# Patient Record
Sex: Male | Born: 1980
Health system: Southern US, Community
[De-identification: ages and names within clinical notes are randomized; demographics above are authoritative.]

## PROBLEM LIST (undated history)

## (undated) DIAGNOSIS — F419 Anxiety disorder, unspecified: Secondary | ICD-10-CM

## (undated) HISTORY — PX: OTHER SURGICAL HISTORY: SHX169

---

## 2004-09-24 ENCOUNTER — Emergency Department (HOSPITAL_COMMUNITY): Admission: EM | Admit: 2004-09-24 | Discharge: 2004-09-24 | Payer: Self-pay | Admitting: Emergency Medicine

## 2004-10-06 ENCOUNTER — Emergency Department (HOSPITAL_COMMUNITY): Admission: EM | Admit: 2004-10-06 | Discharge: 2004-10-07 | Payer: Self-pay | Admitting: Emergency Medicine

## 2004-10-09 ENCOUNTER — Emergency Department (HOSPITAL_COMMUNITY): Admission: EM | Admit: 2004-10-09 | Discharge: 2004-10-09 | Payer: Self-pay | Admitting: Emergency Medicine

## 2004-10-15 ENCOUNTER — Encounter: Admission: RE | Admit: 2004-10-15 | Discharge: 2004-10-15 | Payer: Self-pay | Admitting: Gastroenterology

## 2005-04-25 ENCOUNTER — Emergency Department (HOSPITAL_COMMUNITY): Admission: EM | Admit: 2005-04-25 | Discharge: 2005-04-25 | Payer: Self-pay | Admitting: Emergency Medicine

## 2005-04-26 ENCOUNTER — Emergency Department (HOSPITAL_COMMUNITY): Admission: EM | Admit: 2005-04-26 | Discharge: 2005-04-27 | Payer: Self-pay | Admitting: Emergency Medicine

## 2005-04-26 ENCOUNTER — Emergency Department (HOSPITAL_COMMUNITY): Admission: EM | Admit: 2005-04-26 | Discharge: 2005-04-26 | Payer: Self-pay | Admitting: Emergency Medicine

## 2005-04-28 ENCOUNTER — Ambulatory Visit: Payer: Self-pay | Admitting: *Deleted

## 2005-05-04 ENCOUNTER — Emergency Department (HOSPITAL_COMMUNITY): Admission: EM | Admit: 2005-05-04 | Discharge: 2005-05-04 | Payer: Self-pay | Admitting: Emergency Medicine

## 2005-05-14 ENCOUNTER — Ambulatory Visit: Admission: RE | Admit: 2005-05-14 | Discharge: 2005-05-14 | Payer: Self-pay | Admitting: *Deleted

## 2005-05-23 ENCOUNTER — Emergency Department (HOSPITAL_COMMUNITY): Admission: EM | Admit: 2005-05-23 | Discharge: 2005-05-23 | Payer: Self-pay | Admitting: Emergency Medicine

## 2005-05-24 ENCOUNTER — Emergency Department (HOSPITAL_COMMUNITY): Admission: EM | Admit: 2005-05-24 | Discharge: 2005-05-24 | Payer: Self-pay | Admitting: Emergency Medicine

## 2005-05-31 ENCOUNTER — Emergency Department (HOSPITAL_COMMUNITY): Admission: EM | Admit: 2005-05-31 | Discharge: 2005-05-31 | Payer: Self-pay | Admitting: Emergency Medicine

## 2005-06-02 ENCOUNTER — Emergency Department (HOSPITAL_COMMUNITY): Admission: EM | Admit: 2005-06-02 | Discharge: 2005-06-02 | Payer: Self-pay | Admitting: Emergency Medicine

## 2005-06-02 ENCOUNTER — Ambulatory Visit: Payer: Self-pay | Admitting: *Deleted

## 2005-06-13 ENCOUNTER — Emergency Department (HOSPITAL_COMMUNITY): Admission: EM | Admit: 2005-06-13 | Discharge: 2005-06-13 | Payer: Self-pay | Admitting: Emergency Medicine

## 2006-04-06 ENCOUNTER — Emergency Department (HOSPITAL_COMMUNITY): Admission: EM | Admit: 2006-04-06 | Discharge: 2006-04-06 | Payer: Self-pay | Admitting: Family Medicine

## 2008-01-17 ENCOUNTER — Emergency Department (HOSPITAL_COMMUNITY): Admission: EM | Admit: 2008-01-17 | Discharge: 2008-01-18 | Payer: Self-pay | Admitting: Emergency Medicine

## 2008-04-16 ENCOUNTER — Emergency Department (HOSPITAL_COMMUNITY): Admission: EM | Admit: 2008-04-16 | Discharge: 2008-04-16 | Payer: Self-pay | Admitting: Emergency Medicine

## 2008-04-26 ENCOUNTER — Emergency Department (HOSPITAL_COMMUNITY): Admission: EM | Admit: 2008-04-26 | Discharge: 2008-04-26 | Payer: Self-pay | Admitting: Emergency Medicine

## 2008-05-21 ENCOUNTER — Ambulatory Visit: Payer: Self-pay | Admitting: Cardiology

## 2008-05-21 ENCOUNTER — Encounter: Payer: Self-pay | Admitting: Cardiovascular Disease

## 2008-05-21 ENCOUNTER — Ambulatory Visit: Payer: Self-pay | Admitting: Cardiovascular Disease

## 2008-05-21 ENCOUNTER — Ambulatory Visit: Payer: Self-pay

## 2009-03-18 ENCOUNTER — Emergency Department (HOSPITAL_COMMUNITY): Admission: EM | Admit: 2009-03-18 | Discharge: 2009-03-18 | Payer: Self-pay | Admitting: Emergency Medicine

## 2009-03-25 ENCOUNTER — Encounter: Payer: Self-pay | Admitting: Cardiovascular Disease

## 2009-04-25 ENCOUNTER — Encounter: Payer: Self-pay | Admitting: Cardiovascular Disease

## 2009-05-15 DIAGNOSIS — R079 Chest pain, unspecified: Secondary | ICD-10-CM

## 2009-05-15 DIAGNOSIS — R0602 Shortness of breath: Secondary | ICD-10-CM | POA: Insufficient documentation

## 2009-05-15 DIAGNOSIS — R002 Palpitations: Secondary | ICD-10-CM

## 2009-06-02 ENCOUNTER — Encounter (INDEPENDENT_AMBULATORY_CARE_PROVIDER_SITE_OTHER): Payer: Self-pay | Admitting: *Deleted

## 2009-09-25 ENCOUNTER — Emergency Department (HOSPITAL_COMMUNITY): Admission: EM | Admit: 2009-09-25 | Discharge: 2009-09-26 | Payer: Self-pay | Admitting: Emergency Medicine

## 2010-01-13 IMAGING — CR DG CHEST 2V
2 series · 2 of 2 positions shown · non-contrast
Comparison: 04/16/2008 and earlier.

CLINICAL DATA: 27-year-old male with dizziness and shortness of
breath.

CHEST - 2 VIEW

[w chest pa]
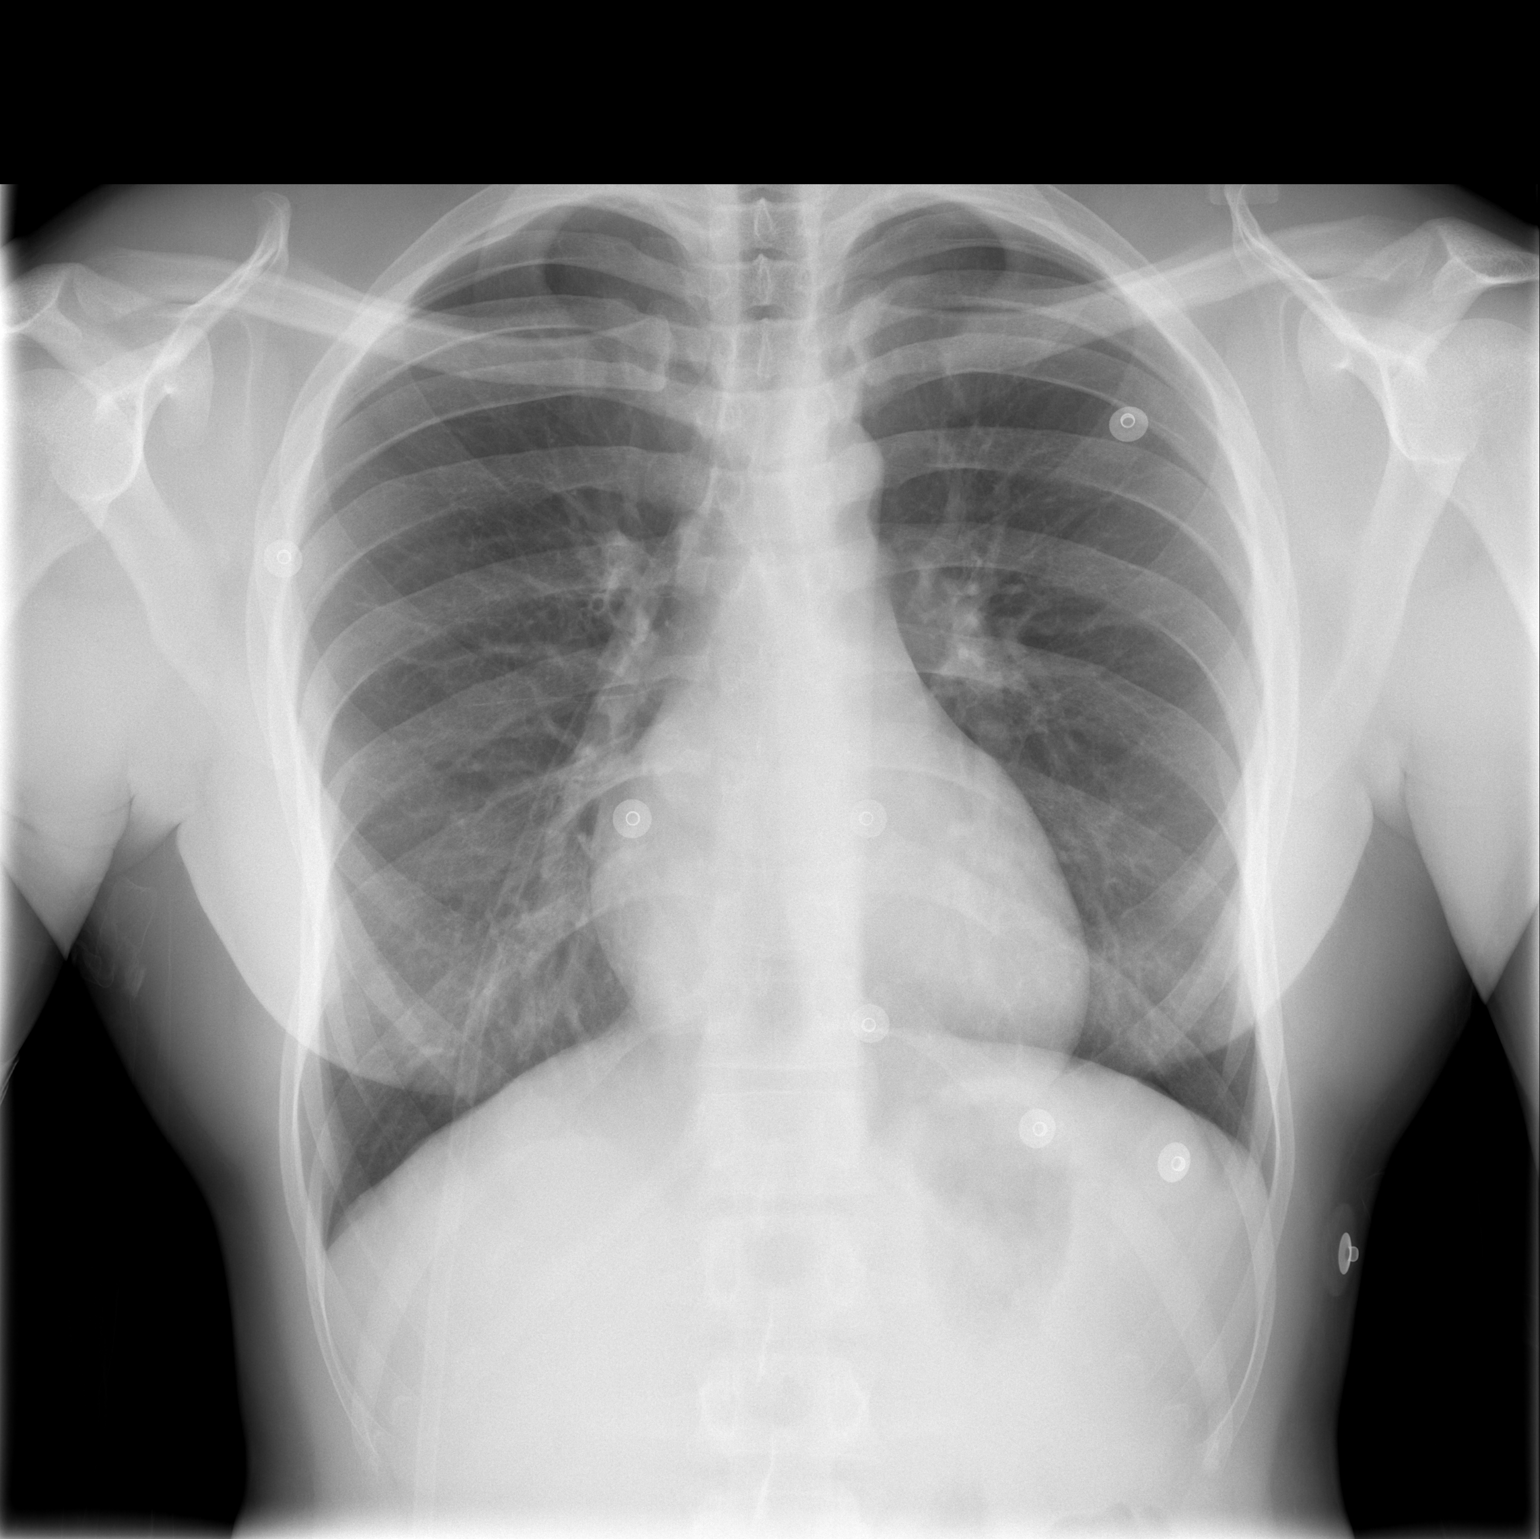

[w chest lat]
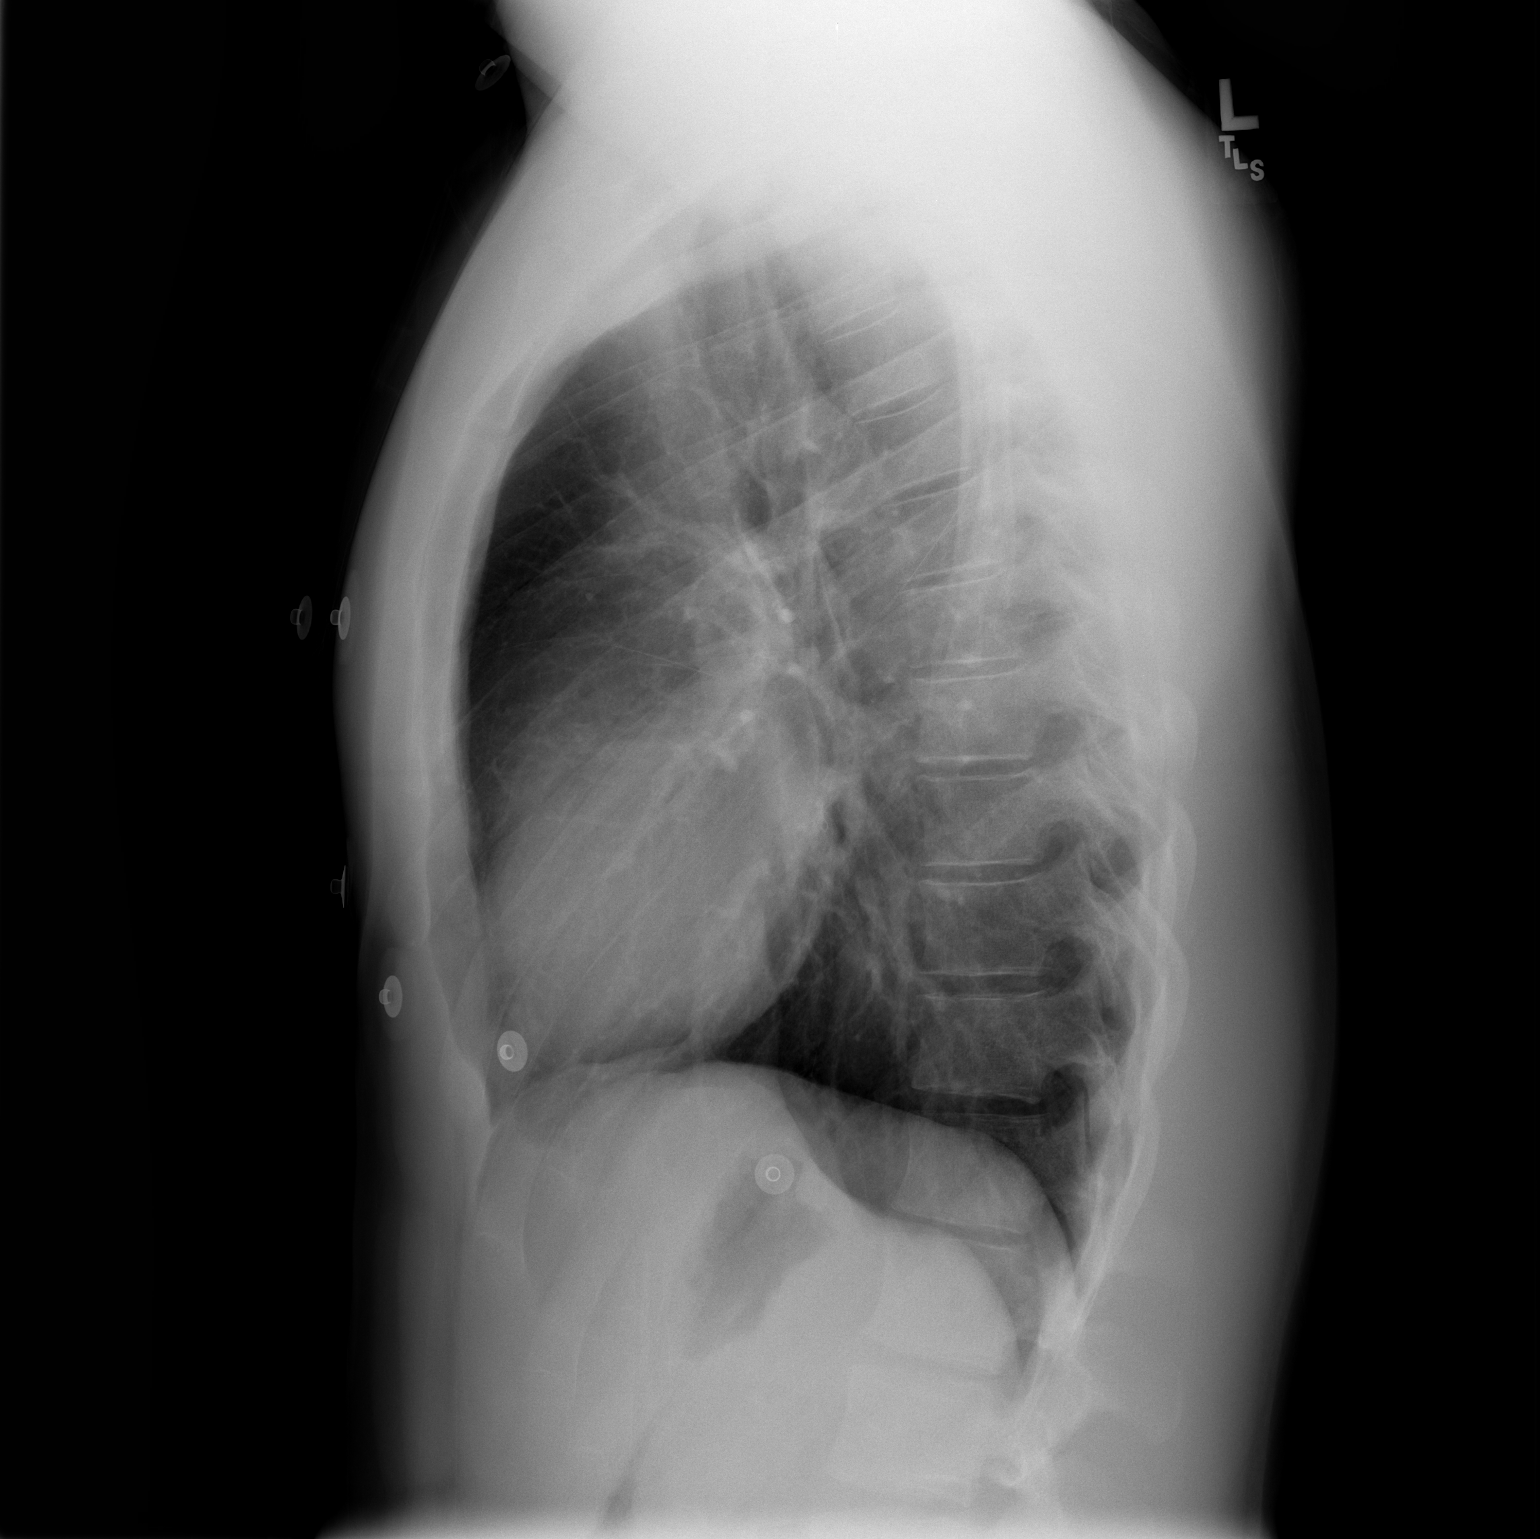

[2 of 2 positions shown; findings below may reference images not displayed]

FINDINGS: Normal cardiac size and mediastinal contours.  Normal
lung volumes.  No pneumothorax or pleural effusion.  The lungs
remain clear.  Tracheal air column within normal limits. No acute
osseous abnormality identified.
IMPRESSION: No acute cardiopulmonary abnormality.

## 2010-01-28 ENCOUNTER — Emergency Department (HOSPITAL_COMMUNITY): Admission: EM | Admit: 2010-01-28 | Discharge: 2010-01-28 | Payer: Self-pay | Admitting: Family Medicine

## 2010-01-30 ENCOUNTER — Emergency Department (HOSPITAL_COMMUNITY): Admission: EM | Admit: 2010-01-30 | Discharge: 2010-01-31 | Payer: Self-pay | Admitting: Emergency Medicine

## 2010-10-31 ENCOUNTER — Emergency Department (HOSPITAL_COMMUNITY)
Admission: EM | Admit: 2010-10-31 | Discharge: 2010-10-31 | Disposition: A | Discharge status: Home / Self Care | Payer: BC Managed Care – PPO | Attending: Emergency Medicine | Admitting: Emergency Medicine

## 2010-10-31 ENCOUNTER — Emergency Department (HOSPITAL_COMMUNITY): Payer: BC Managed Care – PPO

## 2010-10-31 DIAGNOSIS — I319 Disease of pericardium, unspecified: Secondary | ICD-10-CM | POA: Insufficient documentation

## 2010-10-31 DIAGNOSIS — J45909 Unspecified asthma, uncomplicated: Secondary | ICD-10-CM | POA: Insufficient documentation

## 2010-10-31 DIAGNOSIS — R079 Chest pain, unspecified: Secondary | ICD-10-CM | POA: Insufficient documentation

## 2010-10-31 DIAGNOSIS — Z7982 Long term (current) use of aspirin: Secondary | ICD-10-CM | POA: Insufficient documentation

## 2010-12-06 LAB — CBC
Hemoglobin: 15 g/dL (ref 13.0–17.0)
MCHC: 34.7 g/dL (ref 30.0–36.0)
Platelets: 234 10*3/uL (ref 150–400)
RDW: 13.4 % (ref 11.5–15.5)

## 2010-12-06 LAB — BASIC METABOLIC PANEL
BUN: 11 mg/dL (ref 6–23)
CO2: 27 mEq/L (ref 19–32)
Calcium: 9 mg/dL (ref 8.4–10.5)
GFR calc non Af Amer: >60 mL/min (ref >60)
Glucose, Bld: 104 mg/dL — ABNORMAL HIGH (ref 70–99)

## 2010-12-06 LAB — URINALYSIS, ROUTINE W REFLEX MICROSCOPIC
Bilirubin Urine: NEGATIVE
Hgb urine dipstick: NEGATIVE
Nitrite: NEGATIVE
Protein, ur: NEGATIVE mg/dL
Specific Gravity, Urine: 1.029 (ref 1.005–1.030)
Urobilinogen, UA: 0.2 mg/dL (ref 0.0–1.0)

## 2010-12-06 LAB — RAPID URINE DRUG SCREEN, HOSP PERFORMED
Amphetamines: NOT DETECTED
Tetrahydrocannabinol: NOT DETECTED

## 2010-12-06 LAB — DIFFERENTIAL
Basophils Absolute: 0 10*3/uL (ref 0.0–0.1)
Basophils Relative: 0 % (ref 0–1)
Eosinophils Relative: 1 % (ref 0–5)
Lymphocytes Relative: 8 % — ABNORMAL LOW (ref 12–46)
Monocytes Absolute: 0.9 10*3/uL (ref 0.1–1.0)
Neutro Abs: 10.6 10*3/uL — ABNORMAL HIGH (ref 1.7–7.7)

## 2010-12-06 LAB — POCT CARDIAC MARKERS
CKMB, poc: <1 ng/mL — ABNORMAL LOW (ref 1.0–8.0)
Myoglobin, poc: 54.1 ng/mL (ref 12–200)
Troponin i, poc: <0.05 ng/mL (ref 0.00–0.09)

## 2010-12-06 LAB — D-DIMER, QUANTITATIVE: D-Dimer, Quant: 0.29 ug/mL-FEU (ref 0.00–0.48)

## 2011-02-02 NOTE — Assessment & Plan Note (Signed)
[facility]                            CARDIOLOGY OFFICE NOTE   Billy Clarke, Billy Clarke                       MRN:          [id number]  DATE:05/21/2008                            DOB:          16-Sep-1981    HISTORY OF PRESENT ILLNESS:  Mr. Plucinski is a pleasant 30 year old African  American male with past medical history significant for asthma and a  questionable pericarditis 3 years ago who returns today for cardiology  followup with complaints of 3 weeks of atypical left-sided chest pain.  The patient states that approximately 4 weeks ago he had the onset of  left-sided chest pain that occurs under his left breast and on the left  chest wall.  There is radiation of the pain to his neck and shoulder.  The pain has been constant for the last 4 weeks and is associated with  occasional palpitations and shortness of breath.  He has continued to  work full time and has continued to work out in [employer] with this pain.  He was seen in the Emergency Department at [facility] on May 02, 2008.  Workup at that time included an EKG, which showed early  repolarization, a negative chest x-ray, and normal laboratory values.  He was sent home with plan of taking ibuprofen 3 times daily for  possible pericarditis.  Today, he notes that the ibuprofen has not  changed his symptoms significantly.  He has been having this chest  discomfort constantly throughout the day with frequent episodes of  shortness of breath and palpitations.  He is also complaining of some  dizziness when he arises from a seated position to a standing position.  He denies any syncopal episodes.  He also denies any orthopnea, PND, or  lower extremity edema.   His past medical history is significant for asthma and questionable  pericarditis 3 years ago.   PAST SURGICAL HISTORY:  None.   ALLERGIES:  No known drug allergies.   MEDICATIONS:  Ibuprofen 800 mg 3 times daily.   SOCIAL  HISTORY:  The patient has a history of remote tobacco use but has  not smoked in the last 3 years.  He denies use of alcohol or illicit  drugs.  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   FAMILY HISTORY:  He denies any family history of premature coronary  artery disease or sudden cardiac death.  His mother and father are in  their early 7s and are healthy.  He has one older sister and two  younger brothers that are all healthy.   REVIEW OF SYSTEMS:  As stated in history of present illness and is  otherwise negative.   PHYSICAL EXAMINATION:  GENERAL:  He is a pleasant and thin young [place]  American male in no acute distress.  VITAL SIGNS:  Blood pressure 121/68, pulse 51 and regular, and  respirations 12 and unlabored.  NECK:  No JVD and no carotid bruits.  OROPHARYNX:  Mucous membranes moist.  SKIN:  Warm and dry.  LUNGS:  Clear to auscultation bilaterally without  wheezes, rhonchi, or  crackles noted.  CARDIOVASCULAR:  Bradycardia with regular rhythm and no murmurs,  gallops, or rubs noted.  There are no lifts or thrills noted.  ABDOMEN:  Soft.  Bowel sounds present.  EXTREMITIES:  No evidence of edema.  Pulses are 2+ in all extremities.   DIAGNOSTIC STUDIES:  1. A 12-lead electrocardiogram obtained in our office today shows      sinus bradycardia with a ventricular rate of 51 beats per minute      and voltage criteria for left ventricular hypertrophy with early      repolarization abnormalities.  This EKG does not appear consistent      with pericarditis.  When compared to an EKG from June 02, 2005, it is essentially unchanged.  2. Recent laboratory values from April 26, 2008, show white blood cell      count of 8.6, hemoglobin 15.2, platelets 202, sodium 141, potassium      3.8, creatinine 1.35, and troponin 0.05.   ASSESSMENT AND PLAN:  This is a pleasant 30 year old [place] American  male with a history of asthma and questionable pericarditis in  the past  who presents with left-sided chest pain that is atypical in nature and  has been occurring continuously for the last 4 weeks.  This has been  associated with occasional shortness of breath and palpitations.  His  workup 3 years ago for the same complaints included an EKG, which looks  very much like the one today.  His workup then also included an exercise  treadmill study.  The patient exercised into stage 3 of the Bruce  protocol at that time and had normalization of his ST segments during  exercise that returned back to a repolarization abnormality during rest.  He was seen by Dr. Glennon Hamilton at that time back in 2006.  His echo had  been done at an outside facility and the tape was obtained and read by  Dr. Eden Emms here in our office.  He did not feel that there was any  significant fluid collection at that time.  The differential diagnosis  for this atypical chest pain does include pericarditis versus  musculoskeletal.  I will proceed today with obtaining an echocardiogram  to rule out any pericardial fluid and also a 24-hour Holter monitor with  his complaints of palpitations.  The patient does deny any syncopal  episodes but does have some dizziness.  He describes to me no family  history of sudden cardiac death.  I do not feel that his story is  consistent with arrhythmic events; however, I cannot confirm this, as he  has had no continuous telemetry monitoring.  If his echocardiogram and  Holter monitor are nonrevealing, I may consider a cardiac MRI to rule  out any infiltrative heart diseases in this young [place] American male  with complaints of palpitations, shortness of breath, and chest pain.  I  will plan on seeing him back in my office in 2 weeks to review the  results of his studies.  I will make no other medication changes now.  He is encouraged to continue taking his ibuprofen 800 mg 3 times daily.     Verne Carrow, MD  Electronically Signed    CM/MedQ  DD: 05/21/2008  DT: 05/22/2008  Job #: [id number]   cc:   Everardo Beals. Juanda Chance, MD, [facility]

## 2011-06-15 LAB — DIFFERENTIAL
Basophils Absolute: 0.1
Basophils Relative: 1
Eosinophils Absolute: 0.8 — ABNORMAL HIGH
Monocytes Relative: 9
Neutrophils Relative %: 51

## 2011-06-15 LAB — CBC
HCT: 43.1
Hemoglobin: 14.9
RBC: 4.95
WBC: 8.5

## 2011-06-15 LAB — INFLUENZA A+B VIRUS AG-DIRECT(RAPID)
Inflenza A Ag: NEGATIVE
Influenza B Ag: NEGATIVE

## 2011-06-15 LAB — COMPREHENSIVE METABOLIC PANEL
ALT: 32
Alkaline Phosphatase: 44
BUN: 12
CO2: 28
Chloride: 104
GFR calc non Af Amer: >60
Glucose, Bld: 99
Potassium: 3.5
Sodium: 137
Total Bilirubin: 1
Total Protein: 7

## 2011-06-15 LAB — POCT CARDIAC MARKERS: Operator id: 295131

## 2011-06-18 LAB — DIFFERENTIAL
Basophils Absolute: 0
Basophils Absolute: 0.1
Eosinophils Relative: 2
Lymphocytes Relative: 16
Lymphocytes Relative: 17
Monocytes Absolute: 0.8
Monocytes Relative: 10
Neutro Abs: 6.1
Neutrophils Relative %: 73

## 2011-06-18 LAB — POCT URINALYSIS DIP (DEVICE)
Bilirubin Urine: NEGATIVE
Glucose, UA: NEGATIVE
Nitrite: NEGATIVE

## 2011-06-18 LAB — POCT I-STAT, CHEM 8
BUN: 11
Creatinine, Ser: 1.5
HCT: 48
Hemoglobin: 16
Hemoglobin: 16.3
Potassium: 3.9
Potassium: 4.1
Sodium: 142
Sodium: 141
TCO2: 30

## 2011-06-18 LAB — COMPREHENSIVE METABOLIC PANEL
Albumin: 4
BUN: 8
Creatinine, Ser: 1.35
Total Bilirubin: 1.4 — ABNORMAL HIGH
Total Protein: 7

## 2011-06-18 LAB — CBC
HCT: 44.2
HCT: 44.8
MCV: 88.3
Platelets: 251
Platelets: 202
RDW: 13.7
RDW: 14.1

## 2011-06-18 LAB — PROTIME-INR
INR: 1.1
Prothrombin Time: 14.5

## 2011-06-18 LAB — RAPID URINE DRUG SCREEN, HOSP PERFORMED
Cocaine: NOT DETECTED
Opiates: NOT DETECTED

## 2011-06-18 LAB — POCT CARDIAC MARKERS: Troponin i, poc: <0.05

## 2011-06-18 LAB — LIPASE, BLOOD: Lipase: 19

## 2011-07-24 ENCOUNTER — Emergency Department (HOSPITAL_COMMUNITY)
Admission: EM | Admit: 2011-07-24 | Discharge: 2011-07-25 | Disposition: A | Discharge status: Home / Self Care | Payer: BC Managed Care – PPO | Attending: Emergency Medicine | Admitting: Emergency Medicine

## 2011-07-24 DIAGNOSIS — M25569 Pain in unspecified knee: Secondary | ICD-10-CM | POA: Insufficient documentation

## 2011-07-24 DIAGNOSIS — Z79899 Other long term (current) drug therapy: Secondary | ICD-10-CM | POA: Insufficient documentation

## 2011-07-24 DIAGNOSIS — M546 Pain in thoracic spine: Secondary | ICD-10-CM | POA: Insufficient documentation

## 2011-07-24 DIAGNOSIS — S335XXA Sprain of ligaments of lumbar spine, initial encounter: Secondary | ICD-10-CM | POA: Insufficient documentation

## 2011-07-24 DIAGNOSIS — F411 Generalized anxiety disorder: Secondary | ICD-10-CM | POA: Insufficient documentation

## 2011-07-24 DIAGNOSIS — M545 Low back pain, unspecified: Secondary | ICD-10-CM | POA: Insufficient documentation

## 2011-07-24 DIAGNOSIS — X58XXXA Exposure to other specified factors, initial encounter: Secondary | ICD-10-CM | POA: Insufficient documentation

## 2011-07-24 DIAGNOSIS — M2550 Pain in unspecified joint: Secondary | ICD-10-CM | POA: Insufficient documentation

## 2011-07-24 DIAGNOSIS — IMO0001 Reserved for inherently not codable concepts without codable children: Secondary | ICD-10-CM | POA: Insufficient documentation

## 2011-07-24 DIAGNOSIS — J45909 Unspecified asthma, uncomplicated: Secondary | ICD-10-CM | POA: Insufficient documentation

## 2011-07-24 DIAGNOSIS — M2560 Stiffness of unspecified joint, not elsewhere classified: Secondary | ICD-10-CM | POA: Insufficient documentation

## 2011-08-27 ENCOUNTER — Emergency Department (INDEPENDENT_AMBULATORY_CARE_PROVIDER_SITE_OTHER)
Admission: EM | Admit: 2011-08-27 | Discharge: 2011-08-27 | Disposition: A | Discharge status: Home / Self Care | Payer: BC Managed Care – PPO | Source: Home / Self Care | Attending: Emergency Medicine | Admitting: Emergency Medicine

## 2011-08-27 ENCOUNTER — Emergency Department (INDEPENDENT_AMBULATORY_CARE_PROVIDER_SITE_OTHER): Payer: BC Managed Care – PPO

## 2011-08-27 ENCOUNTER — Encounter: Payer: Self-pay | Admitting: *Deleted

## 2011-08-27 DIAGNOSIS — S6980XA Other specified injuries of unspecified wrist, hand and finger(s), initial encounter: Secondary | ICD-10-CM

## 2011-08-27 DIAGNOSIS — IMO0002 Reserved for concepts with insufficient information to code with codable children: Secondary | ICD-10-CM

## 2011-08-27 DIAGNOSIS — S6990XA Unspecified injury of unspecified wrist, hand and finger(s), initial encounter: Secondary | ICD-10-CM

## 2011-08-27 HISTORY — DX: Anxiety disorder, unspecified: F41.9

## 2011-08-27 MED ORDER — CEPHALEXIN 500 MG PO CAPS: 500.0000 mg | ORAL_CAPSULE | Freq: Four times a day (QID) | ORAL | Status: AC

## 2011-08-27 NOTE — ED Notes (Signed)
Pt is here with left second finger injury.  Pt dropped a TV on finger Sunday night.  Finger is red, swollen and painful.  Pt is able to bend.

## 2011-08-27 NOTE — ED Provider Notes (Addendum)
History     CSN: [id number] Arrival date & time: 08/27/2011  9:27 AM   First MD Initiated Contact with Patient 08/27/11 [phone]      Chief Complaint  Patient presents with  . Finger Injury    (Consider location/radiation/quality/duration/timing/severity/associated sxs/prior treatment) HPI Comments: Was moving and lifting a TV Sunday the TV fell on my L index finger, it bled a lot and its very swollen, looks liek my nail its broken and hurst all over here ( points to DIP)  Patient is a 30 y.o. male presenting with hand pain.  Hand Pain This is a new problem. Episode onset: 5 DAYS AGO"    Past Medical History  Diagnosis Date  . Asthma   . Anxiety     History reviewed. No pertinent past surgical history.  History reviewed. No pertinent family history.  History  Substance Use Topics  . Smoking status: Never Smoker   . Smokeless tobacco: Not on file  . Alcohol Use: Yes     occasionally      Review of Systems  Musculoskeletal: Positive for joint swelling.    Allergies  Review of patient's allergies indicates no known allergies.  Home Medications   Current Outpatient Rx  Name Route Sig Dispense Refill  . ALBUTEROL SULFATE HFA 108 (90 BASE) MCG/ACT IN AERS Inhalation Inhale 2 puffs into the lungs every 6 (six) hours as needed. Wheezing or shortness of breath     . ASPIRIN 500 MG PO TABS Oral Take 500 mg by mouth every 6 (six) hours as needed. Pain or muscle aches     . LORAZEPAM 1 MG PO TABS Oral Take 1 mg by mouth daily as needed. anxiety       BP 131/65  Pulse 66  Temp(Src) 99.1 F (37.3 C) (Oral)  Resp 16  SpO2 99%  Physical Exam  Nursing note and vitals reviewed. Constitutional: He appears well-developed and well-nourished.  HENT:  Head: Normocephalic.  Musculoskeletal: He exhibits tenderness.       Left wrist: He exhibits tenderness, swelling and deformity.       Arms: Neurological: He is alert.    ED Course  Procedures (including critical care  time)  Labs Reviewed - No data to display Dg Finger Index Left  08/27/2011  *RADIOLOGY REPORT*  Clinical Data: Trauma.  Pain.  LEFT INDEX FINGER 2+V  Comparison: None.  Findings: No acute bony abnormality.  Specifically, no fracture, subluxation, or dislocation.  Soft tissues are prominent along the nail bed.  IMPRESSION: No acute bony abnormality.  Original Report Authenticated By: Cyndie Chime, M.D.     No diagnosis found.    MDM  NAIL BED PARTIAL AVULSION LUNULA- DETACHED, NO SUBUNGUAL HEMATOMA. NO FRACTURES        Jimmie Molly, MD 08/27/11 1044  Jimmie Molly, MD 08/27/11 1048

## 2012-02-03 ENCOUNTER — Ambulatory Visit (INDEPENDENT_AMBULATORY_CARE_PROVIDER_SITE_OTHER): Payer: BC Managed Care – PPO | Admitting: Family Medicine

## 2012-02-03 VITALS — BP 129/79 | HR 65 | Temp 98.1°F | Resp 16 | Ht 71.0 in | Wt 201.6 lb

## 2012-02-03 DIAGNOSIS — F411 Generalized anxiety disorder: Secondary | ICD-10-CM

## 2012-02-03 DIAGNOSIS — F419 Anxiety disorder, unspecified: Secondary | ICD-10-CM

## 2012-02-03 NOTE — Progress Notes (Signed)
  Subjective:    Patient ID: Billy Clarke, male    DOB: 02-20-1981, 31 y.o.   MRN: [id number]  HPI 31 yo male here complaining of anxiety and panic attack. Had panic attack at work today.  Took Lorazepam.  Felt better.  THen started feeling dizzy.  Now feeling better.   Grandfather just died, Grandmother rushed to hospital, and work stress all weighing.   Brought here by work friends because he was feeling dizzy.  Dizziness passed in about 30 minutes.  Anxiety attack lasted about 15 minutes, abated quckly after the lorazepam.    Review of Systems Negative except as per HPI     Objective:   Physical Exam  Constitutional: He appears well-developed and well-nourished.  Cardiovascular: Normal rate, regular rhythm, normal heart sounds and intact distal pulses.   No murmur heard. Pulmonary/Chest: Effort normal and breath sounds normal.  Neurological: He is alert.  Skin: Skin is warm and dry.          Assessment & Plan:  Anxiety Dizziness - likely side effect of lorazepam.

## 2012-09-12 ENCOUNTER — Ambulatory Visit: Payer: BC Managed Care – PPO

## 2012-09-14 ENCOUNTER — Encounter (HOSPITAL_COMMUNITY): Payer: Self-pay | Admitting: Emergency Medicine

## 2012-09-14 ENCOUNTER — Emergency Department (INDEPENDENT_AMBULATORY_CARE_PROVIDER_SITE_OTHER): Payer: BC Managed Care – PPO

## 2012-09-14 ENCOUNTER — Emergency Department (INDEPENDENT_AMBULATORY_CARE_PROVIDER_SITE_OTHER)
Admission: EM | Admit: 2012-09-14 | Discharge: 2012-09-14 | Disposition: A | Discharge status: Home / Self Care | Payer: BC Managed Care – PPO | Source: Home / Self Care | Attending: Emergency Medicine | Admitting: Emergency Medicine

## 2012-09-14 DIAGNOSIS — S60229A Contusion of unspecified hand, initial encounter: Secondary | ICD-10-CM

## 2012-09-14 MED ORDER — TRAMADOL HCL 50 MG PO TABS: 100.0000 mg | ORAL_TABLET | Freq: Three times a day (TID) | ORAL | Status: DC | PRN

## 2012-09-14 MED ORDER — MELOXICAM 15 MG PO TABS: 15.0000 mg | ORAL_TABLET | Freq: Every day | ORAL | Status: DC

## 2012-09-14 NOTE — ED Provider Notes (Signed)
Chief Complaint  Patient presents with  . Hand Injury    History of Present Illness:   The patient is a 31 year old male who injured his left hand at work a week ago. He thinks he might have banged it on a machine. He is left-handed and his head pain over the first 3 digits of the left hand radiating into the palm of the hand, wrist, forearm, elbow, and to the shoulder and the hand feels numb and tingly and feels weak. He works in [employer] and does a lot of lifting and manual work. He denies any swelling.  Review of Systems:  Other than noted above, the patient denies any of the following symptoms: Systemic:  No fevers, chills, sweats, or aches.  No fatigue or tiredness. Musculoskeletal:  No joint pain, arthritis, bursitis, swelling, back pain, or neck pain. Neurological:  No muscular weakness, paresthesias, headache, or trouble with speech or coordination.  No dizziness.  PMFSH:  Past medical history, family history, social history, meds, and allergies were reviewed.  Physical Exam:   Vital signs:  BP 128/75  Pulse 59  Temp 98.2 F (36.8 C) (Oral)  Resp 18  SpO2 100% Gen:  Alert and oriented times 3.  In no distress. Musculoskeletal: He has diffuse pain to palpation over his entire left hand. There is no swelling, bruising, or deformity. All joints have full range of motion with pain. Sensation and strength are intact. Tinel's and Phalen's signs are negative. Otherwise, all joints had a full a ROM with no swelling, bruising or deformity.  No edema, pulses full. Extremities were warm and pink.  Capillary refill was brisk.  Skin:  Clear, warm and dry.  No rash. Neuro:  Alert and oriented times 3.  Muscle strength was normal.  Sensation was intact to light touch.   Radiology:  Dg Wrist Complete Left  09/14/2012  *RADIOLOGY REPORT*  Clinical Data: Wrist pain 1 week without known trauma.  LEFT WRIST - COMPLETE 3+ VIEW  Comparison:  None.  Findings:  There is no evidence of  fracture or dislocation.  There is no evidence of arthropathy or other focal bone abnormality. Soft tissues are unremarkable.  IMPRESSION: Negative.   Original Report Authenticated By: Janeece Riggers, M.D.    Dg Hand Complete Left  09/14/2012  *RADIOLOGY REPORT*  Clinical Data: Hand pain without known injury.  LEFT HAND - COMPLETE 3+ VIEW  Comparison:  None.  Findings:  There is no evidence of fracture or dislocation.  There is no evidence of arthropathy or other focal bone abnormality. Soft tissues are unremarkable.  IMPRESSION: Negative.   Original Report Authenticated By: Janeece Riggers, M.D.    I reviewed the images independently and personally and concur with the radiologist's findings.  Course in Urgent Care Center:   The patient was put in a thumb spica splint.  Assessment:  The encounter diagnosis was Contusion, hand.  Plan:   1.  The following meds were prescribed:   New Prescriptions   MELOXICAM (MOBIC) 15 MG TABLET    Take 1 tablet (15 mg total) by mouth daily.   TRAMADOL (ULTRAM) 50 MG TABLET    Take 2 tablets (100 mg total) by mouth every 8 (eight) hours as needed for pain.   2.  The patient was instructed in symptomatic care, including rest and activity, elevation, application of ice and compression.  Appropriate handouts were given. 3.  The patient was told to return if becoming worse in any way, if no  better in 3 or 4 days, and given some red flag symptoms that would indicate earlier return.   4.  The patient was told to follow up with Dr. Aldean Baker in one week.    Reuben Likes, MD 09/14/12 [phone]

## 2012-09-14 NOTE — ED Notes (Signed)
Reports left hand injury.  Patient states a week ago he hit hand at work.  Pain radiating to left shoulder.

## 2014-12-03 ENCOUNTER — Emergency Department (HOSPITAL_COMMUNITY)
Admission: EM | Admit: 2014-12-03 | Discharge: 2014-12-03 | Disposition: A | Discharge status: Home / Self Care | Payer: BLUE CROSS/BLUE SHIELD | Attending: Emergency Medicine | Admitting: Emergency Medicine

## 2014-12-03 ENCOUNTER — Encounter (HOSPITAL_COMMUNITY): Payer: Self-pay | Admitting: Emergency Medicine

## 2014-12-03 ENCOUNTER — Emergency Department (HOSPITAL_COMMUNITY): Payer: BLUE CROSS/BLUE SHIELD

## 2014-12-03 DIAGNOSIS — R079 Chest pain, unspecified: Secondary | ICD-10-CM

## 2014-12-03 DIAGNOSIS — Z79899 Other long term (current) drug therapy: Secondary | ICD-10-CM | POA: Insufficient documentation

## 2014-12-03 DIAGNOSIS — H5500 Unspecified nystagmus: Secondary | ICD-10-CM | POA: Diagnosis not present

## 2014-12-03 DIAGNOSIS — B349 Viral infection, unspecified: Secondary | ICD-10-CM | POA: Insufficient documentation

## 2014-12-03 DIAGNOSIS — F419 Anxiety disorder, unspecified: Secondary | ICD-10-CM | POA: Diagnosis not present

## 2014-12-03 DIAGNOSIS — J45909 Unspecified asthma, uncomplicated: Secondary | ICD-10-CM | POA: Diagnosis not present

## 2014-12-03 DIAGNOSIS — R109 Unspecified abdominal pain: Secondary | ICD-10-CM | POA: Diagnosis present

## 2014-12-03 LAB — URINALYSIS, ROUTINE W REFLEX MICROSCOPIC
Bilirubin Urine: NEGATIVE
GLUCOSE, UA: NEGATIVE mg/dL
Hgb urine dipstick: NEGATIVE
Ketones, ur: NEGATIVE mg/dL
LEUKOCYTES UA: NEGATIVE
NITRITE: NEGATIVE
PH: 7.5 (ref 5.0–8.0)
Protein, ur: NEGATIVE mg/dL
Specific Gravity, Urine: 1.007 (ref 1.005–1.030)
Urobilinogen, UA: 0.2 mg/dL (ref 0.0–1.0)

## 2014-12-03 LAB — BASIC METABOLIC PANEL
Anion gap: 5 (ref 5–15)
BUN: 14 mg/dL (ref 6–23)
CHLORIDE: 103 mmol/L (ref 96–112)
CO2: 28 mmol/L (ref 19–32)
CREATININE: 1.27 mg/dL (ref 0.50–1.35)
Calcium: 9.1 mg/dL (ref 8.4–10.5)
GFR calc Af Amer: 85 mL/min — ABNORMAL LOW (ref >90)
GFR calc non Af Amer: 73 mL/min — ABNORMAL LOW (ref >90)
Glucose, Bld: 82 mg/dL (ref 70–99)
Potassium: 3.6 mmol/L (ref 3.5–5.1)
Sodium: 136 mmol/L (ref 135–145)

## 2014-12-03 LAB — CBC
HCT: 43.5 % (ref 39.0–52.0)
Hemoglobin: 14.5 g/dL (ref 13.0–17.0)
MCH: 29.8 pg (ref 26.0–34.0)
MCHC: 33.3 g/dL (ref 30.0–36.0)
MCV: 89.5 fL (ref 78.0–100.0)
PLATELETS: 237 10*3/uL (ref 150–400)
RBC: 4.86 MIL/uL (ref 4.22–5.81)
RDW: 13.6 % (ref 11.5–15.5)
WBC: 7.7 10*3/uL (ref 4.0–10.5)

## 2014-12-03 NOTE — Discharge Instructions (Signed)
Hypertension (High Blood Pressure) Hypertension is the medical term for higher than normal blood pressure. Hypertension occurs in people of all ages but is common as a persistent problem when people age. There are many times when blood pressure increases above "normal" levels, such as during exercise or when afraid. However, after these periods, blood pressure returns to normal levels. Persistent (chronic) hypertension makes the heart, kidneys, and blood vessels work harder. This puts people with hypertension at an increased risk for heart attack, stroke, kidney disease, and peripheral vascular disease (obstruction of arteries). There are two types of hypertension: essential and unessential. Up to 70% of affected people have essential hypertension, which means the reason for their disease is unknown. SYMPTOMS   Hypertension often has no symptoms, especially in the early stages of the disease.  Headache.  Nosebleeds.  Numbness and tingling of the hands and feet (paresthesia).  Shortness of breath.  Rapid heart rate felt in the chest (palpitations).  Decrease in athletic performance. CAUSES   Anabolic steroid use.  Stimulants (caffeine, cocaine, ma huang, phenylpropanolamine, amphetamines).  Obesity.  Kidney disease.  Diseases of the adrenal glands.  Too much alcohol use.  Too much sodium (salt) intake.  Narrowing of the large blood vessels of the body (coarctation of the aorta).  Vascular disease. RISK INCREASES WITH:  Obesity.  Inactive lifestyle.  Use of anabolic steroids.  High sodium diet.  Use of stimulants (cocaine, ma huang, amphetamines, ephedrine).  Too much alcohol use.  Family history of hypertension.  Age (especially older than 74). PREVENTION  Hypertension cannot be prevented, only controlled.  Good control prevents complications and improves athletic performance.  To prevent hypertension from getting out of control:  Avoid drinking too much  alcohol.  Avoid excessive use of stimulants.  Be sure that intense exercise does not worsen blood pressure.  Do not smoke.  Avoid isometric exercises.  Do not hold breath when lifting weights. Exhale during the difficult part of the lift. PROGNOSIS   Without proper treatment, hypertension can shorten the life span and reduce quality of life.  If treated properly, hypertension does not affect quality of life or life expectancy, and athletes can maintain their level of play.  Life expectancy is normal or close to normal with good control of blood pressure. RELATED COMPLICATIONS  Heart attack.  Stroke.  Kidney failure.  Heart failure.  Poor tolerance of exercise. TREATMENT  Diagnostic tests may be given to determine a cause for hypertension. These tests include an electrocardiogram (ECG), blood tests, chest X-rays, and 24-hour monitoring of blood pressure to confirm readings. In some cases, studies to visualize the kidneys may be performed.  First, treatment involves diet modification (reducing sodium and alcohol intake), exercise, and weight loss. If hypertension is not under control after 3 to 6 months, medicines may be given to lower blood pressure.  Patients should stop smoking.  Athletes should learn to monitor their own blood pressure. MEDICATIONS  Medicines, such as hydrochlorothiazide, may be given to lower blood pressure if lifestyle changes alone are not enough.  Blood pressure medicines may affect athletic performance in different ways. Athletes should consider side effects before choosing a hypertension medicine. ACTIVITY  Studies have shown that regular physical activity improves blood pressure control.  Moderate cardiovascular exercise (i.e., brisk walking) may be best for control of hypertension.  Certain high intensity exercises (i.e., weight training) may increase resting blood pressure. Some caregivers may advise athletes to stop strength training until  blood pressure is reduced.  Circuit  training has been shown to be safe for athletes with hypertension. DIET  Athletes should consider a low sodium diet but should watch for heat cramps with exercise.  Athletes who are overweight should try to reduce weight. SEEK MEDICAL CARE IF:   Medicines are poorly tolerated.  Athletic performance decreases suddenly or does not improve with treatment.  Chest pain, shortness of breath, or heart palpitations occur with exercise. Document Released: 09/06/2005 Document Revised: 01/21/2014 Document Reviewed: 12/19/2008 [facility] Patient Information 2015 [place], [place]. This information is not intended to replace advice given to you by your health care provider. Make sure you discuss any questions you have with your health care provider. Viral Infections A viral infection can be caused by different types of viruses.Most viral infections are not serious and resolve on their own. However, some infections may cause severe symptoms and may lead to further complications. SYMPTOMS Viruses can frequently cause:  Minor sore throat.  Aches and pains.  Headaches.  Runny nose.  Different types of rashes.  Watery eyes.  Tiredness.  Cough.  Loss of appetite.  Gastrointestinal infections, resulting in nausea, vomiting, and diarrhea. These symptoms do not respond to antibiotics because the infection is not caused by bacteria. However, you might catch a bacterial infection following the viral infection. This is sometimes called a "superinfection." Symptoms of such a bacterial infection may include:  Worsening sore throat with pus and difficulty swallowing.  Swollen neck glands.  Chills and a high or persistent fever.  Severe headache.  Tenderness over the sinuses.  Persistent overall ill feeling (malaise), muscle aches, and tiredness (fatigue).  Persistent cough.  Yellow, green, or Tenny mucus production with coughing. HOME CARE INSTRUCTIONS     Only take over-the-counter or prescription medicines for pain, discomfort, diarrhea, or fever as directed by your caregiver.  Drink enough water and fluids to keep your urine clear or pale yellow. Sports drinks can provide valuable electrolytes, sugars, and hydration.  Get plenty of rest and maintain proper nutrition. Soups and broths with crackers or rice are fine. SEEK IMMEDIATE MEDICAL CARE IF:   You have severe headaches, shortness of breath, chest pain, neck pain, or an unusual rash.  You have uncontrolled vomiting, diarrhea, or you are unable to keep down fluids.  You or your child has an oral temperature above 102 F (38.9 C), not controlled by medicine.  Your baby is older than 3 months with a rectal temperature of 102 F (38.9 C) or higher.  Your baby is 683 months old or younger with a rectal temperature of 100.4 F (38 C) or higher. MAKE SURE YOU:   Understand these instructions.  Will watch your condition.  Will get help right away if you are not doing well or get worse. Document Released: 06/16/2005 Document Revised: 11/29/2011 Document Reviewed: 01/11/2011 [facility]ExitCare Patient Information 2015 [place]Care, [place]. This information is not intended to replace advice given to you by your health care provider. Make sure you discuss any questions you have with your health care provider.  Please use ibuprofen or naprosyn for muscle aches and pain.  Monitor your blood pressure as discussed.  Follow-up with your primary care provider if symptoms persist.

## 2014-12-03 NOTE — ED Notes (Addendum)
Pt has multiple compaints: L side numbness for past few days, weakness, sob, chest pain, abd pain, headache, doesn't feel right. Pt speaking in full sentences and ambulatory. Pt states back pain is the most concerning symptom today.

## 2014-12-03 NOTE — ED Provider Notes (Signed)
CSN: [id number]     Arrival date & time 12/03/14  1404 History   First MD Initiated Contact with Patient 12/03/14 1632     Chief Complaint  Patient presents with  . multiple complaints      (Consider location/radiation/quality/duration/timing/severity/associated sxs/prior Treatment) Patient is a 34 y.o. male presenting with flank pain.  Flank Pain This is a new problem. The current episode started in the past 7 days. The problem has been gradually worsening. Associated symptoms include abdominal pain, chills and numbness. Pertinent negatives include no coughing, fever, nausea, neck pain, urinary symptoms or vomiting.    Past Medical History  Diagnosis Date  . Asthma   . Anxiety    History reviewed. No pertinent past surgical history. History reviewed. No pertinent family history. History  Substance Use Topics  . Smoking status: Never Smoker   . Smokeless tobacco: Not on file  . Alcohol Use: Yes     Comment: occasionally    Review of Systems  Constitutional: Positive for chills. Negative for fever.  Respiratory: Negative for cough.   Gastrointestinal: Positive for abdominal pain. Negative for nausea and vomiting.  Genitourinary: Positive for flank pain.  Musculoskeletal: Negative for neck pain.  Neurological: Positive for numbness.  All other systems reviewed and are negative.     Allergies  Review of patient's allergies indicates no known allergies.  Home Medications   Prior to Admission medications   Medication Sig Start Date End Date Taking? Authorizing Provider  albuterol (PROVENTIL HFA;VENTOLIN HFA) 108 (90 BASE) MCG/ACT inhaler Inhale 2 puffs into the lungs every 6 (six) hours as needed. Wheezing or shortness of breath    Yes Historical Provider, MD  diphenhydrAMINE (SOMINEX) 25 MG tablet Take 25 mg by mouth once.   Yes Historical Provider, MD  Multiple Vitamin (MULTIVITAMIN WITH MINERALS) TABS tablet Take 1 tablet by mouth daily.   Yes Historical Provider, MD   meloxicam (MOBIC) 15 MG tablet Take 1 tablet (15 mg total) by mouth daily. Patient not taking: Reported on 12/03/2014 09/14/12   Reuben Likes, MD  traMADol (ULTRAM) 50 MG tablet Take 2 tablets (100 mg total) by mouth every 8 (eight) hours as needed for pain. Patient not taking: Reported on 12/03/2014 09/14/12   Reuben Likes, MD   BP 132/77 mmHg  Pulse 71  Temp(Src) 98.4 F (36.9 C) (Oral)  Resp 16  SpO2 100% Physical Exam  Constitutional: He is oriented to person, place, and time. He appears well-developed and well-nourished.  HENT:  Head: Normocephalic.  Mouth/Throat: Oropharynx is clear and moist. No oropharyngeal exudate.  Eyes: Conjunctivae are normal. Pupils are equal, round, and reactive to light. Right eye exhibits nystagmus. Left eye exhibits no nystagmus.  Neck: Neck supple.  Cardiovascular: Normal rate, regular rhythm, normal heart sounds and intact distal pulses.   Pulmonary/Chest: Effort normal and breath sounds normal. He exhibits tenderness.  Abdominal: Soft.  Musculoskeletal: He exhibits no edema or tenderness.  Lymphadenopathy:    He has no cervical adenopathy.  Neurological: He is alert and oriented to person, place, and time. No cranial nerve deficit. Coordination normal.  Skin: Skin is warm and dry.  Psychiatric: He has a normal mood and affect.  Nursing note and vitals reviewed.   ED Course  Procedures (including critical care time) Labs Review Labs Reviewed  BASIC METABOLIC PANEL - Abnormal; Notable for the following:    GFR calc non Af Amer 73 (*)    GFR calc Af Amer 85 (*)  All other components within normal limits  CBC  URINALYSIS, ROUTINE W REFLEX MICROSCOPIC    Imaging Review No results found.   EKG Interpretation None     Patient reports several day history of general malaise, left upper and lower extremity numbness, left anterior chest wall pain, left flank pain, intermittent headache.  Numbness is intermittent.  No visual  disturbance.  Neuro exam grossly normal.  No strength deficit.  Lab and radiology results reviewed, shared with patient, are reassuring.  Suspect viral illness.  Return precautions discussed.  Patient to follow-up with his PCP. MDM   Final diagnoses:  Chest pain    Viral illness.    Felicie Mornavid Tyneisha Hegeman, NP 12/03/14 2322  Glynn OctaveStephen Rancour, MD 12/03/14 [phone]

## 2014-12-13 ENCOUNTER — Emergency Department (HOSPITAL_BASED_OUTPATIENT_CLINIC_OR_DEPARTMENT_OTHER): Payer: BLUE CROSS/BLUE SHIELD

## 2014-12-13 ENCOUNTER — Emergency Department (HOSPITAL_BASED_OUTPATIENT_CLINIC_OR_DEPARTMENT_OTHER)
Admission: EM | Admit: 2014-12-13 | Discharge: 2014-12-13 | Disposition: A | Discharge status: Home / Self Care | Payer: BLUE CROSS/BLUE SHIELD | Attending: Emergency Medicine | Admitting: Emergency Medicine

## 2014-12-13 ENCOUNTER — Encounter (HOSPITAL_BASED_OUTPATIENT_CLINIC_OR_DEPARTMENT_OTHER): Payer: Self-pay | Admitting: *Deleted

## 2014-12-13 DIAGNOSIS — Z791 Long term (current) use of non-steroidal anti-inflammatories (NSAID): Secondary | ICD-10-CM | POA: Insufficient documentation

## 2014-12-13 DIAGNOSIS — R0602 Shortness of breath: Secondary | ICD-10-CM | POA: Diagnosis present

## 2014-12-13 DIAGNOSIS — F419 Anxiety disorder, unspecified: Secondary | ICD-10-CM | POA: Insufficient documentation

## 2014-12-13 DIAGNOSIS — Z79899 Other long term (current) drug therapy: Secondary | ICD-10-CM | POA: Diagnosis not present

## 2014-12-13 DIAGNOSIS — J069 Acute upper respiratory infection, unspecified: Secondary | ICD-10-CM | POA: Diagnosis not present

## 2014-12-13 DIAGNOSIS — J45901 Unspecified asthma with (acute) exacerbation: Secondary | ICD-10-CM | POA: Insufficient documentation

## 2014-12-13 NOTE — ED Notes (Signed)
Pt sts he was lying down apprx. 1 hour ago and suddenly had SOB. Pt sts he has had these episodes ona nd off since last Tues. Pt was recently admitted for same symptoms.

## 2014-12-13 NOTE — ED Provider Notes (Signed)
CSN: [id number]     Arrival date & time 12/13/14  1823 History  This chart was scribed for Pricilla Loveless, MD by Evon Slack, ED Scribe. This patient was seen in room MH06/[facility]06 and the patient's care was started at 8:06 PM.    Chief Complaint  Patient presents with  . Shortness of Breath   Patient is a 34 y.o. male presenting with shortness of breath. The history is provided by the patient. No language interpreter was used.  Shortness of Breath Associated symptoms: cough and wheezing   Associated symptoms: no ear pain, no fever, no headaches and no vomiting    HPI Comments: Billy Clarke is a 34 y.o. male with PMHx of asthma and anxiety who presents to the Emergency Department complaining of sudden onset of SOB 1 hour PTA. Pt states that his symptoms began while lying down. Pt states that he has associated cough, chest tightness congestion and wheezing. Pt reports intermittent CP as well.  Pt reports his throat feeling scratchy. Pt states that he has tried his albuterol inhaler PTA that provided slight relief. Pt states that he was diagnosed with viral infection 10 days prior and states that his symptoms have been intermittent and unchanged since. Denies fever, leg swelling, vomiting, HA or ear pain.    Past Medical History  Diagnosis Date  . Asthma   . Anxiety    History reviewed. No pertinent past surgical history. No family history on file. History  Substance Use Topics  . Smoking status: Never Smoker   . Smokeless tobacco: Not on file  . Alcohol Use: Yes     Comment: occasionally    Review of Systems  Constitutional: Negative for fever.  HENT: Positive for congestion. Negative for ear pain, trouble swallowing and voice change.   Respiratory: Positive for cough, chest tightness, shortness of breath and wheezing.   Gastrointestinal: Negative for vomiting.  Neurological: Negative for headaches.  All other systems reviewed and are negative.   Allergies  Review of  patient's allergies indicates no known allergies.  Home Medications   Prior to Admission medications   Medication Sig Start Date End Date Taking? Authorizing Provider  albuterol (PROVENTIL HFA;VENTOLIN HFA) 108 (90 BASE) MCG/ACT inhaler Inhale 2 puffs into the lungs every 6 (six) hours as needed. Wheezing or shortness of breath     Historical Provider, MD  diphenhydrAMINE (SOMINEX) 25 MG tablet Take 25 mg by mouth once.    Historical Provider, MD  meloxicam (MOBIC) 15 MG tablet Take 1 tablet (15 mg total) by mouth daily. Patient not taking: Reported on 12/03/2014 09/14/12   Reuben Likes, MD  Multiple Vitamin (MULTIVITAMIN WITH MINERALS) TABS tablet Take 1 tablet by mouth daily.    Historical Provider, MD  traMADol (ULTRAM) 50 MG tablet Take 2 tablets (100 mg total) by mouth every 8 (eight) hours as needed for pain. Patient not taking: Reported on 12/03/2014 09/14/12   Reuben Likes, MD   BP 119/79 mmHg  Pulse 77  Temp(Src) 98.2 F (36.8 C) (Oral)  Resp 22  Ht 6' (1.829 m)  Wt 201 lb (91.173 kg)  BMI 27.25 kg/m2  SpO2 100%   Physical Exam  Constitutional: He is oriented to person, place, and time. He appears well-developed and well-nourished. No distress.  HENT:  Head: Normocephalic and atraumatic.  Right Ear: External ear normal.  Left Ear: External ear normal.  Nose: Nose normal.  Mouth/Throat: Oropharynx is clear and moist. No oropharyngeal exudate.  Neck: Neck supple.  No tracheal deviation present.  Cardiovascular: Normal rate, regular rhythm and normal heart sounds.   Pulmonary/Chest: Effort normal and breath sounds normal. No respiratory distress. He has no wheezes.  Abdominal: He exhibits no distension. There is no tenderness.  Neurological: He is alert and oriented to person, place, and time.  Skin: Skin is warm and dry. He is not diaphoretic.  Psychiatric: He has a normal mood and affect. His behavior is normal.  Nursing note and vitals reviewed.   ED Course   Procedures (including critical care time) DIAGNOSTIC STUDIES: Oxygen Saturation is 100% on RA, normal by my interpretation.    COORDINATION OF CARE: 8:16 PM-Discussed treatment plan with pt at bedside and pt agreed to plan.     Labs Review Labs Reviewed - No data to display  Imaging Review Dg Chest 2 View  12/13/2014   CLINICAL DATA:  Cough and shortness of Breath for 10 days.  EXAM: CHEST  2 VIEW  COMPARISON:  12/03/2014.  FINDINGS: The heart size and mediastinal contours are within normal limits. Both lungs are clear. The visualized skeletal structures are unremarkable.  IMPRESSION: Normal chest x-ray.   Electronically Signed   By: Rudie MeyerP.  Gallerani M.D.   On: 12/13/2014 18:46     EKG Interpretation None      MDM   Final diagnoses:  Upper respiratory infection  Asthma attack    Patient with an apparent URI and likely intermittent asthma symptoms. Whenever he takes his albuterol inhaler his shortness of breath and wheezing go away. He has no acute bronchospasm in the ER. Patient feels well at this time, discussed symptomatic care, albuterol as needed, and over-the-counter treatments. Is normal exam of low concern for acute bacterial pathology or other pathology causing his symptoms.  I personally performed the services described in this documentation, which was scribed in my presence. The recorded information has been reviewed and is accurate.      Pricilla LovelessScott Daxon Kyne, MD 12/14/14 [phone]

## 2014-12-13 NOTE — Discharge Instructions (Signed)
Asthma, Acute Bronchospasm °Acute bronchospasm caused by asthma is also referred to as an asthma attack. Bronchospasm means your air passages become narrowed. The narrowing is caused by inflammation and tightening of the muscles in the air tubes (bronchi) in your lungs. This can make it hard to breathe or cause you to wheeze and cough. °CAUSES °Possible triggers are: °· Animal dander from the skin, hair, or feathers of animals. °· Dust mites contained in house dust. °· Cockroaches. °· Pollen from trees or grass. °· Mold. °· Cigarette or tobacco smoke. °· Air pollutants such as dust, household cleaners, hair sprays, aerosol sprays, paint fumes, strong chemicals, or strong odors. °· Cold air or weather changes. Cold air may trigger inflammation. Winds increase molds and pollens in the air. °· Strong emotions such as crying or laughing hard. °· Stress. °· Certain medicines such as aspirin or beta-blockers. °· Sulfites in foods and drinks, such as dried fruits and wine. °· Infections or inflammatory conditions, such as a flu, cold, or inflammation of the nasal membranes (rhinitis). °· Gastroesophageal reflux disease (GERD). GERD is a condition where stomach acid backs up into your esophagus. °· Exercise or strenuous activity. °SIGNS AND SYMPTOMS  °· Wheezing. °· Excessive coughing, particularly at night. °· Chest tightness. °· Shortness of breath. °DIAGNOSIS  °Your health care provider will ask you about your medical history and perform a physical exam. A chest X-ray or blood testing may be performed to look for other causes of your symptoms or other conditions that may have triggered your asthma attack.  °TREATMENT  °Treatment is aimed at reducing inflammation and opening up the airways in your lungs.  Most asthma attacks are treated with inhaled medicines. These include quick relief or rescue medicines (such as bronchodilators) and controller medicines (such as inhaled corticosteroids). These medicines are sometimes  given through an inhaler or a nebulizer. Systemic steroid medicine taken by mouth or given through an IV tube also can be used to reduce the inflammation when an attack is moderate or severe. Antibiotic medicines are only used if a bacterial infection is present.  °HOME CARE INSTRUCTIONS  °· Rest. °· Drink plenty of liquids. This helps the mucus to remain thin and be easily coughed up. Only use caffeine in moderation and do not use alcohol until you have recovered from your illness. °· Do not smoke. Avoid being exposed to secondhand smoke. °· You play a critical role in keeping yourself in good health. Avoid exposure to things that cause you to wheeze or to have breathing problems. °· Keep your medicines up-to-date and available. Carefully follow your health care provider's treatment plan. °· Take your medicine exactly as prescribed. °· When pollen or pollution is bad, keep windows closed and use an air conditioner or go to places with air conditioning. °· Asthma requires careful medical care. See your health care provider for a follow-up as advised. If you are more than [redacted] weeks pregnant and you were prescribed any new medicines, let your obstetrician know about the visit and how you are doing. Follow up with your health care provider as directed. °· After you have recovered from your asthma attack, make an appointment with your outpatient doctor to talk about ways to reduce the likelihood of future attacks. If you do not have a doctor who manages your asthma, make an appointment with a primary care doctor to discuss your asthma. °SEEK IMMEDIATE MEDICAL CARE IF:  °· You are getting worse. °· You have trouble breathing. If severe, call your local   emergency services (911 in the [place] °· You develop chest pain or discomfort. °· You are vomiting. °· You are not able to keep fluids down. °· You are coughing up yellow, green, [place], or bloody sputum. °· You have a fever and your symptoms suddenly get worse. °· You have  trouble swallowing. °MAKE SURE YOU:  °· Understand these instructions. °· Will watch your condition. °· Will get help right away if you are not doing well or get worse. °Document Released: 12/22/2006 Document Revised: 09/11/2013 Document Reviewed: 03/14/2013 °Exit[employer]® Patient Information ©2015 [employer]. This information is not intended to replace advice given to you by your health care provider. Make sure you discuss any questions you have with your health care provider. ° °Upper Respiratory Infection, Adult °An upper respiratory infection (URI) is also sometimes known as the common cold. The upper respiratory tract includes the nose, sinuses, throat, trachea, and bronchi. Bronchi are the airways leading to the lungs. Most people improve within 1 week, but symptoms can last up to 2 weeks. A residual cough may last even longer.  °CAUSES °Many different viruses can infect the tissues lining the upper respiratory tract. The tissues become irritated and inflamed and often become very moist. Mucus production is also common. A cold is contagious. You can easily spread the virus to others by oral contact. This includes kissing, sharing a glass, coughing, or sneezing. Touching your mouth or nose and then touching a surface, which is then touched by another person, can also spread the virus. °SYMPTOMS  °Symptoms typically develop 1 to 3 days after you come in contact with a cold virus. Symptoms vary from person to person. They may include: °· Runny nose. °· Sneezing. °· Nasal congestion. °· Sinus irritation. °· Sore throat. °· Loss of voice (laryngitis). °· Cough. °· Fatigue. °· Muscle aches. °· Loss of appetite. °· Headache. °· Low-grade fever. °DIAGNOSIS  °You might diagnose your own cold based on familiar symptoms, since most people get a cold 2 to 3 times a year. Your caregiver can confirm this based on your exam. Most importantly, your caregiver can check that your symptoms are not due to another disease such as  strep throat, sinusitis, pneumonia, asthma, or epiglottitis. Blood tests, throat tests, and X-rays are not necessary to diagnose a common cold, but they may sometimes be helpful in excluding other more serious diseases. Your caregiver will decide if any further tests are required. °RISKS AND COMPLICATIONS  °You may be at risk for a more severe case of the common cold if you smoke cigarettes, have chronic heart disease (such as heart failure) or lung disease (such as asthma), or if you have a weakened immune system. The very young and very old are also at risk for more serious infections. Bacterial sinusitis, middle ear infections, and bacterial pneumonia can complicate the common cold. The common cold can worsen asthma and chronic obstructive pulmonary disease (COPD). Sometimes, these complications can require emergency medical care and may be life-threatening. °PREVENTION  °The best way to protect against getting a cold is to practice good hygiene. Avoid oral or hand contact with people with cold symptoms. Wash your hands often if contact occurs. There is no clear evidence that vitamin C, vitamin E, echinacea, or exercise reduces the chance of developing a cold. However, it is always recommended to get plenty of rest and practice good nutrition. °TREATMENT  °Treatment is directed at relieving symptoms. There is no cure. Antibiotics are not effective, because the infection is caused   by a virus, not by bacteria. Treatment may include: °· Increased fluid intake. Sports drinks offer valuable electrolytes, sugars, and fluids. °· Breathing heated mist or steam (vaporizer or shower). °· Eating chicken soup or other clear broths, and maintaining good nutrition. °· Getting plenty of rest. °· Using gargles or lozenges for comfort. °· Controlling fevers with ibuprofen or acetaminophen as directed by your caregiver. °· Increasing usage of your inhaler if you have asthma. °Zinc gel and zinc lozenges, taken in the first 24 hours  of the common cold, can shorten the duration and lessen the severity of symptoms. Pain medicines may help with fever, muscle aches, and throat pain. A variety of non-prescription medicines are available to treat congestion and runny nose. Your caregiver can make recommendations and may suggest nasal or lung inhalers for other symptoms.  °HOME CARE INSTRUCTIONS  °· Only take over-the-counter or prescription medicines for pain, discomfort, or fever as directed by your caregiver. °· Use a warm mist humidifier or inhale steam from a shower to increase air moisture. This may keep secretions moist and make it easier to breathe. °· Drink enough water and fluids to keep your urine clear or pale yellow. °· Rest as needed. °· Return to work when your temperature has returned to normal or as your caregiver advises. You may need to stay home longer to avoid infecting others. You can also use a face mask and careful hand washing to prevent spread of the virus. °SEEK MEDICAL CARE IF:  °· After the first few days, you feel you are getting worse rather than better. °· You need your caregiver's advice about medicines to control symptoms. °· You develop chills, worsening shortness of breath, or Storer or red sputum. These may be signs of pneumonia. °· You develop yellow or [place] nasal discharge or pain in the face, especially when you bend forward. These may be signs of sinusitis. °· You develop a fever, swollen neck glands, pain with swallowing, or white areas in the back of your throat. These may be signs of strep throat. °SEEK IMMEDIATE MEDICAL CARE IF:  °· You have a fever. °· You develop severe or persistent headache, ear pain, sinus pain, or chest pain. °· You develop wheezing, a prolonged cough, cough up blood, or have a change in your usual mucus (if you have chronic lung disease). °· You develop sore muscles or a stiff neck. °Document Released: 03/02/2001 Document Revised: 11/29/2011 Document Reviewed: 12/12/2013 °ExitCare®  Patient Information ©2015 [employer]. This information is not intended to replace advice given to you by your health care provider. Make sure you discuss any questions you have with your health care provider. ° °

## 2015-10-17 ENCOUNTER — Emergency Department (HOSPITAL_BASED_OUTPATIENT_CLINIC_OR_DEPARTMENT_OTHER): Payer: BLUE CROSS/BLUE SHIELD

## 2015-10-17 ENCOUNTER — Encounter (HOSPITAL_BASED_OUTPATIENT_CLINIC_OR_DEPARTMENT_OTHER): Payer: Self-pay | Admitting: *Deleted

## 2015-10-17 ENCOUNTER — Emergency Department (HOSPITAL_BASED_OUTPATIENT_CLINIC_OR_DEPARTMENT_OTHER)
Admission: EM | Admit: 2015-10-17 | Discharge: 2015-10-17 | Disposition: A | Discharge status: Home / Self Care | Payer: BLUE CROSS/BLUE SHIELD | Attending: Emergency Medicine | Admitting: Emergency Medicine

## 2015-10-17 DIAGNOSIS — J45909 Unspecified asthma, uncomplicated: Secondary | ICD-10-CM | POA: Insufficient documentation

## 2015-10-17 DIAGNOSIS — F419 Anxiety disorder, unspecified: Secondary | ICD-10-CM | POA: Insufficient documentation

## 2015-10-17 DIAGNOSIS — Z79899 Other long term (current) drug therapy: Secondary | ICD-10-CM | POA: Insufficient documentation

## 2015-10-17 DIAGNOSIS — M5440 Lumbago with sciatica, unspecified side: Secondary | ICD-10-CM | POA: Insufficient documentation

## 2015-10-17 LAB — URINALYSIS, ROUTINE W REFLEX MICROSCOPIC
Bilirubin Urine: NEGATIVE
GLUCOSE, UA: NEGATIVE mg/dL
HGB URINE DIPSTICK: NEGATIVE
KETONES UR: NEGATIVE mg/dL
Leukocytes, UA: NEGATIVE
Nitrite: NEGATIVE
Protein, ur: NEGATIVE mg/dL
Specific Gravity, Urine: 1.024 (ref 1.005–1.030)
pH: 6.5 (ref 5.0–8.0)

## 2015-10-17 MED ORDER — OXYCODONE-ACETAMINOPHEN 5-325 MG PO TABS
2.0000 | ORAL_TABLET | Freq: Once | ORAL | Status: AC
Start: 2015-10-17 — End: 2015-10-17
  Administered 2015-10-17: 2 via ORAL
  Filled 2015-10-17: qty 2

## 2015-10-17 MED ORDER — TRAMADOL HCL 50 MG PO TABS: 50.0000 mg | ORAL_TABLET | Freq: Four times a day (QID) | ORAL | Status: DC | PRN

## 2015-10-17 MED ORDER — DEXAMETHASONE SODIUM PHOSPHATE 10 MG/ML IJ SOLN
10.0000 mg | Freq: Once | INTRAMUSCULAR | Status: AC
  Administered 2015-10-17: 10 mg via INTRAMUSCULAR
  Filled 2015-10-17: qty 1

## 2015-10-17 MED FILL — traMADol HCL 50 MG TABS: 50 | 4 days supply | Qty: 15 | Fill #0

## 2015-10-17 NOTE — ED Provider Notes (Signed)
CSN: [id number]     Arrival date & time 10/17/15  1502 History   First MD Initiated Contact with Patient 10/17/15 1540     Chief Complaint  Patient presents with  . Back Pain     (Consider location/radiation/quality/duration/timing/severity/associated sxs/prior Treatment) HPI Comments: Patient presents with back pain. He has a history of asthma and anxiety. He reports a one-week history of worsening pain to his lower back. He states at this point the pain is radiating to his anterior thighs, bilaterally. He denies any numbness or weakness to his legs. He denies loss of bowel or bladder control. He denies any urinary symptoms. There is no abdominal pain. No nausea or vomiting. No fevers. No history of recent injuries to his back. He's been taking Tylenol and Motrin without relief in symptoms. The pain seems to be worse when he gets up from a sitting position and bends over to put his socks and shoes on.  Patient is a 35 y.o. male presenting with back pain.  Back Pain Associated symptoms: no abdominal pain, no chest pain, no fever, no headaches, no numbness and no weakness     Past Medical History  Diagnosis Date  . Asthma   . Anxiety    History reviewed. No pertinent past surgical history. No family history on file. Social History  Substance Use Topics  . Smoking status: Never Smoker   . Smokeless tobacco: None  . Alcohol Use: Yes     Comment: occasionally    Review of Systems  Constitutional: Negative for fever, chills, diaphoresis and fatigue.  HENT: Negative for congestion, rhinorrhea and sneezing.   Eyes: Negative.   Respiratory: Negative for cough, chest tightness and shortness of breath.   Cardiovascular: Negative for chest pain and leg swelling.  Gastrointestinal: Negative for nausea, vomiting, abdominal pain, diarrhea and blood in stool.  Genitourinary: Negative for frequency, hematuria, flank pain and difficulty urinating.  Musculoskeletal: Positive for back pain.  Negative for arthralgias.  Skin: Negative for rash.  Neurological: Negative for dizziness, speech difficulty, weakness, numbness and headaches.      Allergies  Review of patient's allergies indicates no known allergies.  Home Medications   Prior to Admission medications   Medication Sig Start Date End Date Taking? Authorizing Provider  albuterol (PROVENTIL HFA;VENTOLIN HFA) 108 (90 BASE) MCG/ACT inhaler Inhale 2 puffs into the lungs every 6 (six) hours as needed. Wheezing or shortness of breath     Historical Provider, MD  diphenhydrAMINE (SOMINEX) 25 MG tablet Take 25 mg by mouth once.    Historical Provider, MD  meloxicam (MOBIC) 15 MG tablet Take 1 tablet (15 mg total) by mouth daily. Patient not taking: Reported on 12/03/2014 09/14/12   Reuben Likes, MD  Multiple Vitamin (MULTIVITAMIN WITH MINERALS) TABS tablet Take 1 tablet by mouth daily.    Historical Provider, MD  traMADol (ULTRAM) 50 MG tablet Take 1 tablet (50 mg total) by mouth every 6 (six) hours as needed. 10/17/15   Rolan Bucco, MD   BP 144/91 mmHg  Pulse 76  Temp(Src) 98.6 F (37 C) (Oral)  Resp 20  Ht 6' (1.829 m)  Wt 213 lb (96.616 kg)  BMI 28.88 kg/m2  SpO2 98% Physical Exam  Constitutional: He is oriented to person, place, and time. He appears well-developed and well-nourished.  HENT:  Head: Normocephalic and atraumatic.  Eyes: Pupils are equal, round, and reactive to light.  Neck: Normal range of motion. Neck supple.  Cardiovascular: Normal rate, regular rhythm and normal  heart sounds.   Pulmonary/Chest: Effort normal and breath sounds normal. No respiratory distress. He has no wheezes. He has no rales. He exhibits no tenderness.  Abdominal: Soft. Bowel sounds are normal. There is no tenderness. There is no rebound and no guarding.  Musculoskeletal: Normal range of motion. He exhibits no edema.  There is tenderness to his lumbar spine over the L4-L5 area. There is positive straight leg raise on the left.  Patellar reflexes are symmetric bilaterally. He has normal sensation and motor function in the lower extremities bilaterally. Pedal pulses are intact and symmetric. There are no rashes.  Lymphadenopathy:    He has no cervical adenopathy.  Neurological: He is alert and oriented to person, place, and time.  Skin: Skin is warm and dry. No rash noted.  Psychiatric: He has a normal mood and affect.    ED Course  Procedures (including critical care time) Labs Review Labs Reviewed  URINALYSIS, ROUTINE W REFLEX MICROSCOPIC (NOT AT [facility])    Imaging Review Dg Lumbar Spine Complete  10/17/2015  CLINICAL DATA:  Low back pain for 1 week, no known injury EXAM: LUMBAR SPINE - COMPLETE 4+ VIEW COMPARISON:  None. FINDINGS: Five views of lumbar spine submitted. No acute fracture or subluxation. Minimal disc space flattening at L5-S1 level. Alignment and vertebral body heights are preserved. IMPRESSION: No acute fracture or subluxation. Minimal disc space flattening at L5-S1 level. Electronically Signed   By: Natasha Mead M.D.   On: 10/17/2015 16:34   I have personally reviewed and evaluated these images and lab results as part of my medical decision-making.   EKG Interpretation None      MDM   Final diagnoses:  Low back pain with sciatica, sciatica laterality unspecified, unspecified back pain laterality    Patient presents with radicular low back pain. He has no neurologic deficits or evidence of cauda equina. There is no significant abnormal findings on diagnostic x-rays.  He was treated symptomatically with Decadron and tramadol for pain. He was encouraged to follow-up with his PCP if his symptoms are not improving.    Rolan Bucco, MD 10/17/15 [phone]

## 2015-10-17 NOTE — ED Notes (Signed)
Lower back pain x 2 weeks. This week he has had radiation into both legs.

## 2015-10-17 NOTE — Discharge Instructions (Signed)
Radicular Pain °Radicular pain in either the arm or leg is usually from a bulging or herniated disk in the spine. A piece of the herniated disk may press against the nerves as the nerves exit the spine. This causes pain which is felt at the tips of the nerves down the arm or leg. Other causes of radicular pain may include: °· Fractures. °· Heart disease. °· Cancer. °· An abnormal and usually degenerative state of the nervous system or nerves (neuropathy). °Diagnosis may require CT or MRI scanning to determine the primary cause.  °Nerves that start at the neck (nerve roots) may cause radicular pain in the outer shoulder and arm. It can spread down to the thumb and fingers. The symptoms vary depending on which nerve root has been affected. In most cases radicular pain improves with conservative treatment. Neck problems may require physical therapy, a neck collar, or cervical traction. Treatment may take many weeks, and surgery may be considered if the symptoms do not improve.  °Conservative treatment is also recommended for sciatica. Sciatica causes pain to radiate from the lower back or buttock area down the leg into the foot. Often there is a history of back problems. Most patients with sciatica are better after 2 to 4 weeks of rest and other supportive care. Short term bed rest can reduce the disk pressure considerably. Sitting, however, is not a good position since this increases the pressure on the disk. You should avoid bending, lifting, and all other activities which make the problem worse. Traction can be used in severe cases. Surgery is usually reserved for patients who do not improve within the first months of treatment. °Only take over-the-counter or prescription medicines for pain, discomfort, or fever as directed by your caregiver. Narcotics and muscle relaxants may help by relieving more severe pain and spasm and by providing mild sedation. Cold or massage can give significant relief. Spinal manipulation  is not recommended. It can increase the degree of disc protrusion. Epidural steroid injections are often effective treatment for radicular pain. These injections deliver medicine to the spinal nerve in the space between the protective covering of the spinal cord and back bones (vertebrae). Your caregiver can give you more information about steroid injections. These injections are most effective when given within two weeks of the onset of pain.  °You should see your caregiver for follow up care as recommended. A program for neck and back injury rehabilitation with stretching and strengthening exercises is an important part of management.  °SEEK IMMEDIATE MEDICAL CARE IF: °· You develop increased pain, weakness, or numbness in your arm or leg. °· You develop difficulty with bladder or bowel control. °· You develop abdominal pain. °  °This information is not intended to replace advice given to you by your health care provider. Make sure you discuss any questions you have with your health care provider. °  °Document Released: 10/14/2004 Document Revised: 09/27/2014 Document Reviewed: 04/02/2015 °[employer] Interactive Patient Education ©2016 [employer] ° °

## 2015-10-29 ENCOUNTER — Emergency Department (HOSPITAL_COMMUNITY)
Admission: EM | Admit: 2015-10-29 | Discharge: 2015-10-29 | Disposition: A | Discharge status: Home / Self Care | Payer: BLUE CROSS/BLUE SHIELD | Attending: Emergency Medicine | Admitting: Emergency Medicine

## 2015-10-29 ENCOUNTER — Encounter (HOSPITAL_COMMUNITY): Payer: Self-pay

## 2015-10-29 DIAGNOSIS — F419 Anxiety disorder, unspecified: Secondary | ICD-10-CM | POA: Insufficient documentation

## 2015-10-29 DIAGNOSIS — M5136 Other intervertebral disc degeneration, lumbar region: Secondary | ICD-10-CM

## 2015-10-29 DIAGNOSIS — Z79899 Other long term (current) drug therapy: Secondary | ICD-10-CM | POA: Insufficient documentation

## 2015-10-29 DIAGNOSIS — M5440 Lumbago with sciatica, unspecified side: Secondary | ICD-10-CM | POA: Insufficient documentation

## 2015-10-29 DIAGNOSIS — S3992XA Unspecified injury of lower back, initial encounter: Secondary | ICD-10-CM | POA: Insufficient documentation

## 2015-10-29 DIAGNOSIS — M62830 Muscle spasm of back: Secondary | ICD-10-CM

## 2015-10-29 DIAGNOSIS — X58XXXA Exposure to other specified factors, initial encounter: Secondary | ICD-10-CM | POA: Insufficient documentation

## 2015-10-29 DIAGNOSIS — Y9389 Activity, other specified: Secondary | ICD-10-CM | POA: Insufficient documentation

## 2015-10-29 DIAGNOSIS — M5441 Lumbago with sciatica, right side: Secondary | ICD-10-CM

## 2015-10-29 DIAGNOSIS — M5442 Lumbago with sciatica, left side: Secondary | ICD-10-CM

## 2015-10-29 DIAGNOSIS — Y9289 Other specified places as the place of occurrence of the external cause: Secondary | ICD-10-CM | POA: Insufficient documentation

## 2015-10-29 DIAGNOSIS — Y998 Other external cause status: Secondary | ICD-10-CM | POA: Insufficient documentation

## 2015-10-29 DIAGNOSIS — M4806 Spinal stenosis, lumbar region: Secondary | ICD-10-CM | POA: Insufficient documentation

## 2015-10-29 DIAGNOSIS — J45909 Unspecified asthma, uncomplicated: Secondary | ICD-10-CM | POA: Insufficient documentation

## 2015-10-29 MED ORDER — MELOXICAM 15 MG PO TABS: 15.0000 mg | ORAL_TABLET | Freq: Every day | ORAL | Status: DC

## 2015-10-29 MED ORDER — KETOROLAC TROMETHAMINE 30 MG/ML IJ SOLN
60.0000 mg | Freq: Once | INTRAMUSCULAR | Status: AC
  Administered 2015-10-29: 60 mg via INTRAMUSCULAR
  Filled 2015-10-29: qty 2

## 2015-10-29 NOTE — ED Notes (Addendum)
Patient reports that he injured his lower back either at the gym or at work. Patient was seen 2 weeks ago for the same and was given medication. Patient states the meds are not helping. Patient denieis numbness or tingling of extremities. Pain radiates down both legs

## 2015-10-29 NOTE — Discharge Instructions (Signed)
Back Pain: Your back pain should be treated with medicines such as ibuprofen or aleve and this back pain should get better over the next 2 weeks.  However if you develop severe or worsening pain, low back pain with fever, numbness, weakness or inability to walk or urinate, you should return to the ER immediately.  Please follow up with your doctor this week for a recheck if still having symptoms.  Avoid heavy lifting over 10 pounds over the next two weeks.  Low back pain is discomfort in the lower back that may be due to injuries to muscles and ligaments around the spine.  Occasionally, it may be caused by a a problem to a part of the spine called a disc.  The pain may last several days or a week;  However, most patients get completely well in 4 weeks.  Self - care:  The application of heat can help soothe the pain.  Maintaining your daily activities, including walking, is encourged, as it will help you get better faster than just staying in bed. Perform gentle stretching as discussed. Drink plenty of fluids.  Medications are also useful to help with pain control.  A commonly prescribed medication includes tramadol (which is what they gave you the last time).  Do not drive or operate heavy machinery while taking this medication.  Non steroidal anti inflammatory medications including Ibuprofen and naproxen or mobic;  These medications help both pain and swelling and are very useful in treating back pain.  They should be taken with food, as they can cause stomach upset, and more seriously, stomach bleeding.     SEEK IMMEDIATE MEDICAL ATTENTION IF: New numbness, tingling, weakness, or problem with the use of your arms or legs.  Severe back pain not relieved with medications.  Difficulty with or loss of control of your bowel or bladder control.  Increasing pain in any areas of the body (such as chest or abdominal pain).  Shortness of breath, dizziness or fainting.  Nausea (feeling sick to your  stomach), vomiting, fever, or sweats.  You will need to follow up with the orthopedist in 1-2 weeks for reassessment.   Chronic Back Pain  When back pain lasts longer than 3 months, it is called chronic back pain.People with chronic back pain often go through certain periods that are more intense (flare-ups).  CAUSES Chronic back pain can be caused by wear and tear (degeneration) on different structures in your back. These structures include:  The bones of your spine (vertebrae) and the joints surrounding your spinal cord and nerve roots (facets).  The strong, fibrous tissues that connect your vertebrae (ligaments). Degeneration of these structures may result in pressure on your nerves. This can lead to constant pain. HOME CARE INSTRUCTIONS  Avoid bending, heavy lifting, prolonged sitting, and activities which make the problem worse.  Take brief periods of rest throughout the day to reduce your pain. Lying down or standing usually is better than sitting while you are resting.  Take over-the-counter or prescription medicines only as directed by your caregiver. SEEK IMMEDIATE MEDICAL CARE IF:   You have weakness or numbness in one of your legs or feet.  You have trouble controlling your bladder or bowels.  You have nausea, vomiting, abdominal pain, shortness of breath, or fainting.   This information is not intended to replace advice given to you by your health care provider. Make sure you discuss any questions you have with your health care provider.   Document Released: 10/14/2004  Document Revised: 11/29/2011 Document Reviewed: 02/24/2015 Elsevier Interactive Patient Education 2016 [place].  Back Exercises If you have pain in your back, do these exercises 2-3 times each day or as told by your doctor. When the pain goes away, do the exercises once each day, but repeat the steps more times for each exercise (do more repetitions). If you do not have pain in your back, do these  exercises once each day or as told by your doctor. EXERCISES Single Knee to Chest Do these steps 3-5 times in a row for each leg:  Lie on your back on a firm bed or the floor with your legs stretched out.  Bring one knee to your chest.  Hold your knee to your chest by grabbing your knee or thigh.  Pull on your knee until you feel a gentle stretch in your lower back.  Keep doing the stretch for 10-30 seconds.  Slowly let go of your leg and straighten it. Pelvic Tilt Do these steps 5-10 times in a row:  Lie on your back on a firm bed or the floor with your legs stretched out.  Bend your knees so they point up to the ceiling. Your feet should be flat on the floor.  Tighten your lower belly (abdomen) muscles to press your lower back against the floor. This will make your tailbone point up to the ceiling instead of pointing down to your feet or the floor.  Stay in this position for 5-10 seconds while you gently tighten your muscles and breathe evenly. Cat-Cow Do these steps until your lower back bends more easily:  Get on your hands and knees on a firm surface. Keep your hands under your shoulders, and keep your knees under your hips. You may put padding under your knees.  Let your head hang down, and make your tailbone point down to the floor so your lower back is round like the back of a cat.  Stay in this position for 5 seconds.  Slowly lift your head and make your tailbone point up to the ceiling so your back hangs low (sags) like the back of a cow.  Stay in this position for 5 seconds. Press-Ups Do these steps 5-10 times in a row:  Lie on your belly (face-down) on the floor.  Place your hands near your head, about shoulder-width apart.  While you keep your back relaxed and keep your hips on the floor, slowly straighten your arms to raise the top half of your body and lift your shoulders. Do not use your back muscles. To make yourself more comfortable, you may change where  you place your hands.  Stay in this position for 5 seconds.  Slowly return to lying flat on the floor. Bridges Do these steps 10 times in a row:  Lie on your back on a firm surface.  Bend your knees so they point up to the ceiling. Your feet should be flat on the floor.  Tighten your butt muscles and lift your butt off of the floor until your waist is almost as high as your knees. If you do not feel the muscles working in your butt and the back of your thighs, slide your feet 1-2 inches farther away from your butt.  Stay in this position for 3-5 seconds.  Slowly lower your butt to the floor, and let your butt muscles relax. If this exercise is too easy, try doing it with your arms crossed over your chest. Belly Crunches Do these  steps 5-10 times in a row:  Lie on your back on a firm bed or the floor with your legs stretched out.  Bend your knees so they point up to the ceiling. Your feet should be flat on the floor.  Cross your arms over your chest.  Tip your chin a little bit toward your chest but do not bend your neck.  Tighten your belly muscles and slowly raise your chest just enough to lift your shoulder blades a tiny bit off of the floor.  Slowly lower your chest and your head to the floor. Back Lifts Do these steps 5-10 times in a row:  Lie on your belly (face-down) with your arms at your sides, and rest your forehead on the floor.  Tighten the muscles in your legs and your butt.  Slowly lift your chest off of the floor while you keep your hips on the floor. Keep the back of your head in line with the curve in your back. Look at the floor while you do this.  Stay in this position for 3-5 seconds.  Slowly lower your chest and your face to the floor. GET HELP IF:  Your back pain gets a lot worse when you do an exercise.  Your back pain does not lessen 2 hours after you exercise. If you have any of these problems, stop doing the exercises. Do not do them again  unless your doctor says it is okay. GET HELP RIGHT AWAY IF:  You have sudden, very bad back pain. If this happens, stop doing the exercises. Do not do them again unless your doctor says it is okay.   This information is not intended to replace advice given to you by your health care provider. Make sure you discuss any questions you have with your health care provider.   Document Released: 10/09/2010 Document Revised: 05/28/2015 Document Reviewed: 10/31/2014 Elsevier Interactive Patient Education 2016 [place] [address] Injury Prevention Back injuries can be very painful. They can also be difficult to heal. After having one back injury, you are more likely to injure your back again. It is important to learn how to avoid injuring or re-injuring your back. The following tips can help you to prevent a back injury. WHAT SHOULD I KNOW ABOUT PHYSICAL FITNESS?  Exercise for 30 minutes per day on most days of the week or as told by your doctor. Make sure to:  Do aerobic exercises, such as walking, jogging, biking, or swimming.  Do exercises that increase balance and strength, such as tai chi and yoga.  Do stretching exercises. This helps with flexibility.  Try to develop strong belly (abdominal) muscles. Your belly muscles help to support your back.  Stay at a healthy weight. This helps to decrease your risk of a back injury. WHAT SHOULD I KNOW ABOUT MY DIET?  Talk with your doctor about your overall diet. Take supplements and vitamins only as told by your doctor.  Talk with your doctor about how much calcium and vitamin D you need each day. These nutrients help to prevent weakening of the bones (osteoporosis).  Include good sources of calcium in your diet, such as:  Dairy products.  Green leafy vegetables.  Products that have had calcium added to them (fortified).  Include good sources of vitamin D in your diet, such as:  Milk.  Foods that have had vitamin D added to them. WHAT  SHOULD I KNOW ABOUT MY POSTURE?  Sit up straight and stand up straight. Avoid  leaning forward when you sit or hunching over when you stand.  Choose chairs that have good low-back (lumbar) support.  If you work at a desk, sit close to it so you do not need to lean over. Keep your chin tucked in. Keep your neck drawn back. Keep your elbows bent so your arms look like the letter "L" (right angle).  Sit high and close to the steering wheel when you drive. Add a low-back support to your car seat, if needed.  Avoid sitting or standing in one position for very long. Take breaks to get up, stretch, and walk around at least one time every hour. Take breaks every hour if you are driving for long periods of time.  Sleep on your side with your knees slightly bent, or sleep on your back with a pillow under your knees. Do not lie on the front of your body to sleep. WHAT SHOULD I KNOW ABOUT LIFTING, TWISTING, AND REACHING Lifting and Heavy Lifting  Avoid heavy lifting, especially lifting over and over again. If you must do heavy lifting:  Stretch before lifting.  Work slowly.  Rest between lifts.  Use a tool such as a cart or a dolly to move objects if one is available.  Make several small trips instead of carrying one heavy load.  Ask for help when you need it, especially when moving big objects.  Follow these steps when lifting:  Stand with your feet shoulder-width apart.  Get as close to the object as you can. Do not pick up a heavy object that is far from your body.  Use handles or lifting straps if they are available.  Bend at your knees. Squat down, but keep your heels off the floor.  Keep your shoulders back. Keep your chin tucked in. Keep your back straight.  Lift the object slowly while you tighten the muscles in your legs, belly, and butt. Keep the object as close to the center of your body as possible.  Follow these steps when putting down a heavy load:  Stand with your  feet shoulder-width apart.  Lower the object slowly while you tighten the muscles in your legs, belly, and butt. Keep the object as close to the center of your body as possible.  Keep your shoulders back. Keep your chin tucked in. Keep your back straight.  Bend at your knees. Squat down, but keep your heels off the floor.  Use handles or lifting straps if they are available. Twisting and Reaching  Avoid lifting heavy objects above your waist.  Do not twist at your waist while you are lifting or carrying a load. If you need to turn, move your feet.  Do not bend over without bending at your knees.  Avoid reaching over your head, across a table, or for an object on a high surface.  WHAT ARE SOME OTHER TIPS?  Avoid wet floors and icy ground. Keep sidewalks clear of ice to prevent falls.   Do not sleep on a mattress that is too soft or too hard.   Keep items that you use often within easy reach.   Put heavier objects on shelves at waist level, and put lighter objects on lower or higher shelves.  Find ways to lower your stress, such as:  Exercise.  Massage.  Relaxation techniques.  Talk with your doctor if you feel anxious or depressed. These conditions can make back pain worse.  Wear flat heel shoes with cushioned soles.  Avoid making quick (sudden)  movements.  Use both shoulder straps when carrying a backpack.  Do not use any tobacco products, including cigarettes, chewing tobacco, or electronic cigarettes. If you need help quitting, ask your doctor.   This information is not intended to replace advice given to you by your health care provider. Make sure you discuss any questions you have with your health care provider.   Document Released: 02/23/2008 Document Revised: 01/21/2015 Document Reviewed: 09/10/2014 Elsevier Interactive Patient Education 2016 [employer]  Degenerative Disk Disease Degenerative disk disease is a condition caused by the changes that  occur in spinal disks as you grow older. Spinal disks are soft and compressible disks located between the bones of your spine (vertebrae). These disks act like shock absorbers. Degenerative disk disease can affect the whole spine. However, the neck and lower back are most commonly affected. Many changes can occur in the spinal disks with aging, such as:  The spinal disks may dry and shrink.  Small tears may occur in the tough, outer covering of the disk (annulus).  The disk space may become smaller due to loss of water.  Abnormal growths in the bone (spurs) may occur. This can put pressure on the nerve roots exiting the spinal canal, causing pain.  The spinal canal may become narrowed. RISK FACTORS   Being overweight.  Having a family history of degenerative disk disease.  Smoking.  There is increased risk if you are doing heavy lifting or have a sudden injury. SIGNS AND SYMPTOMS  Symptoms vary from person to person and may include:  Pain that varies in intensity. Some people have no pain, while others have severe pain. The location of the pain depends on the part of your backbone that is affected.  You will have neck or arm pain if a disk in the neck area is affected.  You will have pain in your back, buttocks, or legs if a disk in the lower back is affected.  Pain that becomes worse while bending, reaching up, or with twisting movements.  Pain that may start gradually and then get worse as time passes. It may also start after a major or minor injury.  Numbness or tingling in the arms or legs. DIAGNOSIS  Your health care provider will ask you about your symptoms and about activities or habits that may cause the pain. He or she may also ask about any injuries, diseases, or treatments you have had. Your health care provider will examine you to check for the range of movement that is possible in the affected area, to check for strength in your extremities, and to check for sensation  in the areas of the arms and legs supplied by different nerve roots. You may also have:   An X-ray of the spine.  Other imaging tests, such as MRI. TREATMENT  Your health care provider will advise you on the best plan for treatment. Treatment may include:  Medicines.  Rehabilitation exercises. HOME CARE INSTRUCTIONS   Follow proper lifting and walking techniques as advised by your health care provider.  Maintain good posture.  Exercise regularly as advised by your health care provider.  Perform relaxation exercises.  Change your sitting, standing, and sleeping habits as advised by your health care provider.  Change positions frequently.  Lose weight or maintain a healthy weight as advised by your health care provider.  Do not use any tobacco products, including cigarettes, chewing tobacco, or electronic cigarettes. If you need help quitting, ask your health care provider.  Wear  supportive footwear.  Take medicines only as directed by your health care provider. SEEK MEDICAL CARE IF:   Your pain does not go away within 1-4 weeks.  You have significant appetite or weight loss. SEEK IMMEDIATE MEDICAL CARE IF:   Your pain is severe.  You notice weakness in your arms, hands, or legs.  You begin to lose control of your bladder or bowel movements.  You have fevers or night sweats. MAKE SURE YOU:   Understand these instructions.  Will watch your condition.  Will get help right away if you are not doing well or get worse.   This information is not intended to replace advice given to you by your health care provider. Make sure you discuss any questions you have with your health care provider.   Document Released: 07/04/2007 Document Revised: 09/27/2014 Document Reviewed: 01/08/2014 Elsevier Interactive Patient Education 2016 [place] therapy can help ease sore, stiff, injured, and tight muscles and joints. Heat relaxes your muscles, which may  help ease your pain.  RISKS AND COMPLICATIONS If you have any of the following conditions, do not use heat therapy unless your health care provider has approved:  Poor circulation.  Healing wounds or scarred skin in the area being treated.  Diabetes, heart disease, or high blood pressure.  Not being able to feel (numbness) the area being treated.  Unusual swelling of the area being treated.  Active infections.  Blood clots.  Cancer.  Inability to communicate pain. This may include young children and people who have problems with their brain function (dementia).  Pregnancy. Heat therapy should only be used on old, pre-existing, or long-lasting (chronic) injuries. Do not use heat therapy on new injuries unless directed by your health care provider. HOW TO USE HEAT THERAPY There are several different kinds of heat therapy, including:  Moist heat pack.  Warm water bath.  Hot water bottle.  Electric heating pad.  Heated gel pack.  Heated wrap.  Electric heating pad. Use the heat therapy method suggested by your health care provider. Follow your health care provider's instructions on when and how to use heat therapy. GENERAL HEAT THERAPY RECOMMENDATIONS  Do not sleep while using heat therapy. Only use heat therapy while you are awake.  Your skin may turn pink while using heat therapy. Do not use heat therapy if your skin turns red.  Do not use heat therapy if you have new pain.  High heat or long exposure to heat can cause burns. Be careful when using heat therapy to avoid burning your skin.  Do not use heat therapy on areas of your skin that are already irritated, such as with a rash or sunburn. SEEK MEDICAL CARE IF:  You have blisters, redness, swelling, or numbness.  You have new pain.  Your pain is worse. MAKE SURE YOU:  Understand these instructions.  Will watch your condition.  Will get help right away if you are not doing well or get worse.   This  information is not intended to replace advice given to you by your health care provider. Make sure you discuss any questions you have with your health care provider.   Document Released: 11/29/2011 Document Revised: 09/27/2014 Document Reviewed: 10/30/2013 Elsevier Interactive Patient Education 2016 [employer]  Muscle Cramps and Spasms Muscle cramps and spasms are when muscles tighten by themselves. They usually get better within minutes. Muscle cramps are painful. They are usually stronger and last longer than muscle spasms. Muscle spasms  may or may not be painful. They can last a few seconds or much longer. HOME CARE  Drink enough fluid to keep your pee (urine) clear or pale yellow.  Massage, stretch, and relax the muscle.  Use a warm towel, heating pad, or warm shower water on tight muscles.  Place ice on the muscle if it is tender or in pain.  Put ice in a plastic bag.  Place a towel between your skin and the bag.  Leave the ice on for 15-20 minutes, 03-04 times a day.  Only take medicine as told by your doctor. GET HELP RIGHT AWAY IF:  Your cramps or spasms get worse, happen more often, or do not get better with time. MAKE SURE YOU:  Understand these instructions.  Will watch your condition.  Will get help right away if you are not doing well or get worse.   This information is not intended to replace advice given to you by your health care provider. Make sure you discuss any questions you have with your health care provider.   Document Released: 08/19/2008 Document Revised: 01/01/2013 Document Reviewed: 08/23/2012 Elsevier Interactive Patient Education 2016 [employer]  Sciatica Sciatica is pain, weakness, numbness, or tingling along your sciatic nerve. The nerve starts in the lower back and runs down the back of each leg. Nerve damage or certain conditions pinch or put pressure on the sciatic nerve. This causes the pain, weakness, and other discomforts of  sciatica. HOME CARE   Only take medicine as told by your doctor.  Apply ice to the affected area for 20 minutes. Do this 3-4 times a day for the first 48-72 hours. Then try heat in the same way.  Exercise, stretch, or do your usual activities if these do not make your pain worse.  Go to physical therapy as told by your doctor.  Keep all doctor visits as told.  Do not wear high heels or shoes that are not supportive.  Get a firm mattress if your mattress is too soft to lessen pain and discomfort. GET HELP RIGHT AWAY IF:   You cannot control when you poop (bowel movement) or pee (urinate).  You have more weakness in your lower back, lower belly (pelvis), butt (buttocks), or legs.  You have redness or puffiness (swelling) of your back.  You have a burning feeling when you pee.  You have pain that gets worse when you lie down.  You have pain that wakes you from your sleep.  Your pain is worse than past pain.  Your pain lasts longer than 4 weeks.  You are suddenly losing weight without reason. MAKE SURE YOU:   Understand these instructions.  Will watch this condition.  Will get help right away if you are not doing well or get worse.   This information is not intended to replace advice given to you by your health care provider. Make sure you discuss any questions you have with your health care provider.   Document Released: 06/15/2008 Document Revised: 05/28/2015 Document Reviewed: 01/16/2012 Elsevier Interactive Patient Education [employer].

## 2015-10-29 NOTE — ED Provider Notes (Signed)
CSN: [id number]     Arrival date & time 10/29/15  6578 History   First MD Initiated Contact with Patient 10/29/15 1003     Chief Complaint  Patient presents with  . Back Injury  . Back Pain     (Consider location/radiation/quality/duration/timing/severity/associated sxs/prior Treatment) HPI Comments: Billy Clarke is a 35 y.o. male with a PMHx of asthma and anxiety, who presents to the ED with complaints of lower back pain ongoing 2.5 weeks. He was seen on 10/17/15 for these symptoms, had an x-ray that showed mild disc space flattening at L5-S1, had a negative UA, was given Decadron and a prescription for tramadol. Patient states that initially for the first few days after the Decadron injection he felt better, then gradually the pain returned and worsened. He states his pain is currently 8/10 constant sharp lower back pain which intermittently radiates down both legs, worse with sitting or standing for prolonged periods of times, and unrelieved with tramadol. He states that he does a lot of heavy lifting at his job and at the gym.  He denies any fevers, chills, chest pain, shortness breath, abdominal pain, nausea, vomiting, diarrhea, constipation, dysuria, hematuria, incontinence of urine or stool, saddle anesthesia or cauda equina symptoms, numbness, tingling, or focal weakness. No hx of CA or IVDU.  Patient is a 35 y.o. male presenting with back pain. The history is provided by the patient and medical records. No language interpreter was used.  Back Pain Location:  Lumbar spine Quality: sharp. Radiates to:  L posterior upper leg and R posterior upper leg Pain severity:  Moderate Pain is:  Same all the time Onset quality:  Gradual Duration:  2 weeks Timing:  Constant Progression:  Waxing and waning Chronicity:  New Context: lifting heavy objects and physical stress   Relieved by: decadron. Worsened by:  Sitting and standing Ineffective treatments:  Narcotics (tramadol) Associated  symptoms: no abdominal pain, no bladder incontinence, no bowel incontinence, no chest pain, no dysuria, no fever, no numbness, no paresthesias, no perianal numbness, no tingling and no weakness   Risk factors: no hx of cancer     Past Medical History  Diagnosis Date  . Asthma   . Anxiety    Past Surgical History  Procedure Laterality Date  . Nail bed repaired     Family History  Problem Relation Age of Onset  . Rheum arthritis Mother    Social History  Substance Use Topics  . Smoking status: Never Smoker   . Smokeless tobacco: Never Used  . Alcohol Use: Yes     Comment: occasionally    Review of Systems  Constitutional: Negative for fever and chills.  Respiratory: Negative for shortness of breath.   Cardiovascular: Negative for chest pain.  Gastrointestinal: Negative for nausea, vomiting, abdominal pain, diarrhea, constipation and bowel incontinence.  Genitourinary: Negative for bladder incontinence, dysuria, hematuria and difficulty urinating (no incontinence).  Musculoskeletal: Positive for back pain. Negative for myalgias and arthralgias.  Skin: Negative for color change.  Allergic/Immunologic: Negative for immunocompromised state.  Neurological: Negative for tingling, weakness, numbness and paresthesias.  Psychiatric/Behavioral: Negative for confusion.   10 Systems reviewed and are negative for acute change except as noted in the HPI.    Allergies  Review of patient's allergies indicates no known allergies.  Home Medications   Prior to Admission medications   Medication Sig Start Date End Date Taking? Authorizing Provider  albuterol (PROVENTIL HFA;VENTOLIN HFA) 108 (90 BASE) MCG/ACT inhaler Inhale 2 puffs into the  lungs every 6 (six) hours as needed. Wheezing or shortness of breath     Historical Provider, MD  diphenhydrAMINE (SOMINEX) 25 MG tablet Take 25 mg by mouth once.    Historical Provider, MD  meloxicam (MOBIC) 15 MG tablet Take 1 tablet (15 mg total) by  mouth daily. Patient not taking: Reported on 12/03/2014 09/14/12   Reuben Likes, MD  Multiple Vitamin (MULTIVITAMIN WITH MINERALS) TABS tablet Take 1 tablet by mouth daily.    Historical Provider, MD  traMADol (ULTRAM) 50 MG tablet Take 1 tablet (50 mg total) by mouth every 6 (six) hours as needed. 10/17/15   Rolan Bucco, MD   BP 151/82 mmHg  Pulse 84  Temp(Src) 98.2 F (36.8 C) (Oral)  Resp 20  SpO2 100% Physical Exam  Constitutional: He is oriented to person, place, and time. Vital signs are normal. He appears well-developed and well-nourished.  Non-toxic appearance. No distress.  Afebrile, nontoxic, NAD  HENT:  Head: Normocephalic and atraumatic.  Mouth/Throat: Mucous membranes are normal.  Eyes: Conjunctivae and EOM are normal. Right eye exhibits no discharge. Left eye exhibits no discharge.  Neck: Normal range of motion. Neck supple. No spinous process tenderness and no muscular tenderness present. No rigidity. Normal range of motion present.  Cardiovascular: Normal rate and intact distal pulses.   Pulmonary/Chest: Effort normal. No respiratory distress.  Abdominal: Normal appearance. He exhibits no distension. There is no CVA tenderness.  Musculoskeletal: Normal range of motion.       Lumbar back: He exhibits tenderness and spasm. He exhibits normal range of motion and no deformity.       Back:  Lumbar spine with FROM intact without spinous process TTP, no bony stepoffs or deformities, with diffuse b/l paraspinous muscle TTP and slight muscle spasms. Strength 5/5 in all extremities, sensation grossly intact in all extremities, +SLR bilaterally, gait steady and nonantalgic. No overlying skin changes.   Neurological: He is alert and oriented to person, place, and time. He has normal strength. No sensory deficit.  Skin: Skin is warm, dry and intact. No rash noted.  Psychiatric: He has a normal mood and affect.  Nursing note and vitals reviewed.   ED Course  Procedures  (including critical care time) Labs Review Labs Reviewed - No data to display  Results for orders placed or performed during the hospital encounter of 10/17/15  Urinalysis, Routine w reflex microscopic (not at [facility])  Result Value Ref Range   Color, Urine YELLOW YELLOW   APPearance CLEAR CLEAR   Specific Gravity, Urine 1.024 1.005 - 1.030   pH 6.5 5.0 - 8.0   Glucose, UA NEGATIVE NEGATIVE mg/dL   Hgb urine dipstick NEGATIVE NEGATIVE   Bilirubin Urine NEGATIVE NEGATIVE   Ketones, ur NEGATIVE NEGATIVE mg/dL   Protein, ur NEGATIVE NEGATIVE mg/dL   Nitrite NEGATIVE NEGATIVE   Leukocytes, UA NEGATIVE NEGATIVE    Imaging Review No results found.  Dg Lumbar Spine Complete  10/17/2015  CLINICAL DATA:  Low back pain for 1 week, no known injury EXAM: LUMBAR SPINE - COMPLETE 4+ VIEW COMPARISON:  None. FINDINGS: Five views of lumbar spine submitted. No acute fracture or subluxation. Minimal disc space flattening at L5-S1 level. Alignment and vertebral body heights are preserved. IMPRESSION: No acute fracture or subluxation. Minimal disc space flattening at L5-S1 level. Electronically Signed   By: Natasha Mead M.D.   On: 10/17/2015 16:34   I have personally reviewed and evaluated these images and lab results as part of my  medical decision-making.   EKG Interpretation None      MDM   Final diagnoses:  Bilateral low back pain with sciatica, sciatica laterality unspecified  Narrowing of lumbar intervertebral disc space  Back muscle spasm    35 y.o. male here with ongoing back pain. Was seen 1.5wks ago for low back pain, had U/A that was neg, and Xray that showed minimal disc space flattening at L5-S1. Had decadron then that helped initially but pain returned. No red flag s/s of low back pain. No s/s of central cord compression or cauda equina. Lower extremities are neurovascularly intact and patient is ambulating without difficulty. Pain likely from disc issue, but doubt need for emergent MRI.  Tenderness diffusely in the lower back, mild spasms.  Doubt need for further emergent work up at this time.  Patient was counseled on back pain precautions and told to do activity as tolerated but do not lift, push, or pull heavy objects more than 10 pounds for the next week. Patient counseled to use ice or heat on back for no longer than 15 minutes every hour.   Discussed IM toradol now and starting mobic. Has tramadol at home for pain. Doubt that another steroid burst/dose would be beneficial in the long-run.  Patient urged to follow-up with orthopedist if pain does not improve with treatment and rest or if pain becomes recurrent. Urged to return with worsening severe pain, loss of bowel or bladder control, trouble walking. The patient verbalizes understanding and agrees with the plan.   BP 151/82 mmHg  Pulse 84  Temp(Src) 98.2 F (36.8 C) (Oral)  Resp 20  SpO2 100%  Meds ordered this encounter  Medications  . ketorolac (TORADOL) 30 MG/ML injection 60 mg    Sig:   . meloxicam (MOBIC) 15 MG tablet    Sig: Take 1 tablet (15 mg total) by mouth daily.    Dispense:  30 tablet    Refill:  0    Order Specific Question:  Supervising Provider    Answer:  Eber Hong [[id number]]     Shabnam Ladd Camprubi-Soms, PA-C 10/29/15 1041  Melene Plan, DO 10/29/15 1433  Melene Plan, DO 10/29/15 1433

## 2016-01-13 ENCOUNTER — Emergency Department (HOSPITAL_COMMUNITY)
Admission: EM | Admit: 2016-01-13 | Discharge: 2016-01-14 | Disposition: A | Discharge status: Home / Self Care | Payer: BLUE CROSS/BLUE SHIELD | Attending: Emergency Medicine | Admitting: Emergency Medicine

## 2016-01-13 ENCOUNTER — Encounter (HOSPITAL_COMMUNITY): Payer: Self-pay | Admitting: *Deleted

## 2016-01-13 DIAGNOSIS — F419 Anxiety disorder, unspecified: Secondary | ICD-10-CM

## 2016-01-13 DIAGNOSIS — R51 Headache: Secondary | ICD-10-CM | POA: Diagnosis present

## 2016-01-13 DIAGNOSIS — R519 Headache, unspecified: Secondary | ICD-10-CM

## 2016-01-13 DIAGNOSIS — Z79899 Other long term (current) drug therapy: Secondary | ICD-10-CM | POA: Insufficient documentation

## 2016-01-13 DIAGNOSIS — J45909 Unspecified asthma, uncomplicated: Secondary | ICD-10-CM | POA: Diagnosis not present

## 2016-01-13 NOTE — ED Notes (Signed)
Pt states that he has had increased anxiety over the last few days; pt states that he has been under a lot of stress; pt denies SI / HI; pt c/o headache from the stress; pt states that his anxiety has been worse today and he feels like he needs some medicine

## 2016-01-14 MED ORDER — LORAZEPAM 1 MG PO TABS: 1.0000 mg | ORAL_TABLET | Freq: Three times a day (TID) | ORAL | Status: DC | PRN

## 2016-01-14 MED ORDER — LORAZEPAM 2 MG/ML IJ SOLN
1.0000 mg | Freq: Once | INTRAMUSCULAR | Status: DC
  Filled 2016-01-14: qty 1

## 2016-01-14 MED ORDER — KETOROLAC TROMETHAMINE 60 MG/2ML IM SOLN
60.0000 mg | Freq: Once | INTRAMUSCULAR | Status: AC
  Administered 2016-01-14: 60 mg via INTRAMUSCULAR
  Filled 2016-01-14: qty 2

## 2016-01-14 MED ORDER — ONDANSETRON 4 MG PO TBDP
4.0000 mg | ORAL_TABLET | Freq: Once | ORAL | Status: AC
  Administered 2016-01-14: 4 mg via ORAL
  Filled 2016-01-14: qty 1

## 2016-01-14 MED ORDER — IBUPROFEN 200 MG PO TABS
600.0000 mg | ORAL_TABLET | Freq: Once | ORAL | Status: DC
  Filled 2016-01-14: qty 3

## 2016-01-14 NOTE — ED Provider Notes (Signed)
CSN: [id number]     Arrival date & time 01/13/16  2258 History   First MD Initiated Contact with Patient 01/14/16 0005     Chief Complaint  Patient presents with  . Headache  . Anxiety     (Consider location/radiation/quality/duration/timing/severity/associated sxs/prior Treatment) HPI  Patient has PMH of anxiety and asthma. He states that he has had anxiety hx for the past 10 years but today his anxiety started around 5 pm because he has been getting enough rest and stressed. His headache started when he woke up around 5 am, left frontal headache which he describes as a typical pain that is throbbing it eases it off but then the headache comes back strong. Denies change in vision denies phonophobia. He endorses photophobia. He says that he has been working a lot and is tired. Denies Si/HI, AVH, depression. He has been on Ativan in the past for anxiety and it worked well. He comes requesting medication for anxiety.  No fevers, neck pain, N/V/D, body aches, SOB, CP  Past Medical History  Diagnosis Date  . Asthma   . Anxiety    Past Surgical History  Procedure Laterality Date  . Nail bed repaired     Family History  Problem Relation Age of Onset  . Rheum arthritis Mother    Social History  Substance Use Topics  . Smoking status: Never Smoker   . Smokeless tobacco: Never Used  . Alcohol Use: Yes     Comment: occasionally    Review of Systems  Review of Systems All other systems negative except as documented in the HPI. All pertinent positives and negatives as reviewed in the HPI.     Allergies  Review of patient's allergies indicates no known allergies.  Home Medications   Prior to Admission medications   Medication Sig Start Date End Date Taking? Authorizing Provider  albuterol (PROVENTIL HFA;VENTOLIN HFA) 108 (90 BASE) MCG/ACT inhaler Inhale 2 puffs into the lungs every 6 (six) hours as needed. Wheezing or shortness of breath    Yes Historical Provider, MD   ibuprofen (ADVIL,MOTRIN) 200 MG tablet Take 400 mg by mouth every 6 (six) hours as needed for headache, mild pain or moderate pain.   Yes Historical Provider, MD  Multiple Vitamin (MULTIVITAMIN WITH MINERALS) TABS tablet Take 1 tablet by mouth daily.   Yes Historical Provider, MD  LORazepam (ATIVAN) 1 MG tablet Take 1 tablet (1 mg total) by mouth 3 (three) times daily as needed for anxiety. 01/14/16   English Tomer Neva Seat, PA-C  meloxicam (MOBIC) 15 MG tablet Take 1 tablet (15 mg total) by mouth daily. Patient not taking: Reported on 01/13/2016 10/29/15   Mercedes Camprubi-Soms, PA-C  traMADol (ULTRAM) 50 MG tablet Take 1 tablet (50 mg total) by mouth every 6 (six) hours as needed. Patient not taking: Reported on 01/13/2016 10/17/15   Rolan Bucco, MD   BP 142/84 mmHg  Pulse 73  Temp(Src) 98.4 F (36.9 C) (Oral)  Resp 18  Wt 102.059 kg  SpO2 97% Physical Exam  Constitutional: He appears well-developed and well-nourished. No distress.  HENT:  Head: Normocephalic and atraumatic.  Right Ear: Tympanic membrane and ear canal normal.  Left Ear: Tympanic membrane and ear canal normal.  Nose: Nose normal.  Mouth/Throat: Uvula is midline, oropharynx is clear and moist and mucous membranes are normal.  Eyes: Pupils are equal, round, and reactive to light.  Neck: Normal range of motion. Neck supple.  Cardiovascular: Normal rate and regular rhythm.   Pulmonary/Chest: Effort  normal.  Abdominal: Soft.  No signs of abdominal distention  Musculoskeletal:  No LE swelling  Neurological: He is alert.  Acting at baseline Cranial nerves grossly intact on exam. Pt alert and oriented x 3 Upper and lower extremity strength is symmetrical and physiologic Normal muscular tone No facial droop Coordination intact, no limb ataxia   Skin: Skin is warm and dry. No rash noted.  Psychiatric: His mood appears anxious. He is not actively hallucinating. He expresses no homicidal and no suicidal ideation.  Nursing note  and vitals reviewed.   ED Course  Procedures (including critical care time) Labs Review Labs Reviewed - No data to display  Imaging Review No results found. I have personally reviewed and evaluated these images and lab results as part of my medical decision-making.   EKG Interpretation None      MDM   Final diagnoses:  Nonintractable headache, unspecified chronicity pattern, unspecified headache type  Anxiety    Pt dealing with lack of sleep and anxiety. Headache is mild and is consistent with previous headaches, no associated neuro deficits.  Requesting Ativan. Denies SI/HI or AVH. No substance abuse. Will write for short course of Ativan and [facility] Referred to PCP for further evaluation of headache- given Toradol IM for headache.  Medications  ketorolac (TORADOL) injection 60 mg (not administered)  ondansetron (ZOFRAN-ODT) disintegrating tablet 4 mg (not administered)    I discussed results, diagnoses and plan with Natale MilchAnkwan D Seckinger. They voice there understanding and questions were answered. We discussed follow-up recommendations and return precautions.     Marlon Peliffany Fabrizio Filip, PA-C 01/14/16 0122  Devoria AlbeIva Knapp, MD 01/14/16 [phone]

## 2016-01-14 NOTE — Discharge Instructions (Signed)
Generalized Anxiety Disorder Generalized anxiety disorder (GAD) is a mental disorder. It interferes with life functions, including relationships, work, and school. GAD is different from normal anxiety, which everyone experiences at some point in their lives in response to specific life events and activities. Normal anxiety actually helps us prepare for and get through these life events and activities. Normal anxiety goes away after the event or activity is over.  GAD causes anxiety that is not necessarily related to specific events or activities. It also causes excess anxiety in proportion to specific events or activities. The anxiety associated with GAD is also difficult to control. GAD can vary from mild to severe. People with severe GAD can have intense waves of anxiety with physical symptoms (panic attacks).  SYMPTOMS The anxiety and worry associated with GAD are difficult to control. This anxiety and worry are related to many life events and activities and also occur more days than not for 6 months or longer. People with GAD also have three or more of the following symptoms (one or more in children):  Restlessness.   Fatigue.  Difficulty concentrating.   Irritability.  Muscle tension.  Difficulty sleeping or unsatisfying sleep. DIAGNOSIS GAD is diagnosed through an assessment by your health care provider. Your health care provider will ask you questions aboutyour mood,physical symptoms, and events in your life. Your health care provider may ask you about your medical history and use of alcohol or drugs, including prescription medicines. Your health care provider may also do a physical exam and blood tests. Certain medical conditions and the use of certain substances can cause symptoms similar to those associated with GAD. Your health care provider may refer you to a mental health specialist for further evaluation. TREATMENT The following therapies are usually used to treat GAD:    Medication. Antidepressant medication usually is prescribed for long-term daily control. Antianxiety medicines may be added in severe cases, especially when panic attacks occur.   Talk therapy (psychotherapy). Certain types of talk therapy can be helpful in treating GAD by providing support, education, and guidance. A form of talk therapy called cognitive behavioral therapy can teach you healthy ways to think about and react to daily life events and activities.  Stress managementtechniques. These include yoga, meditation, and exercise and can be very helpful when they are practiced regularly. A mental health specialist can help determine which treatment is best for you. Some people see improvement with one therapy. However, other people require a combination of therapies.   This information is not intended to replace advice given to you by your health care provider. Make sure you discuss any questions you have with your health care provider.   Document Released: 01/01/2013 Document Revised: 09/27/2014 Document Reviewed: 01/01/2013 Elsevier Interactive Patient Education 2016 [employer]  General Headache Without Cause A headache is pain or discomfort felt around the head or neck area. The specific cause of a headache may not be found. There are many causes and types of headaches. A few common ones are:  Tension headaches.  Migraine headaches.  Cluster headaches.  Chronic daily headaches. HOME CARE INSTRUCTIONS  Watch your condition for any changes. Take these steps to help with your condition: Managing Pain  Take over-the-counter and prescription medicines only as told by your health care provider.  Lie down in a dark, quiet room when you have a headache.  If directed, apply ice to the head and neck area:  Put ice in a plastic bag.  Place a towel between your  skin and the bag.  Leave the ice on for 20 minutes, 2-3 times per day.  Use a heating pad or hot shower to apply  heat to the head and neck area as told by your health care provider.  Keep lights dim if bright lights bother you or make your headaches worse. Eating and Drinking  Eat meals on a regular schedule.  Limit alcohol use.  Decrease the amount of caffeine you drink, or stop drinking caffeine. General Instructions  Keep all follow-up visits as told by your health care provider. This is important.  Keep a headache journal to help find out what may trigger your headaches. For example, write down:  What you eat and drink.  How much sleep you get.  Any change to your diet or medicines.  Try massage or other relaxation techniques.  Limit stress.  Sit up straight, and do not tense your muscles.  Do not use tobacco products, including cigarettes, chewing tobacco, or e-cigarettes. If you need help quitting, ask your health care provider.  Exercise regularly as told by your health care provider.  Sleep on a regular schedule. Get 7-9 hours of sleep, or the amount recommended by your health care provider. SEEK MEDICAL CARE IF:   Your symptoms are not helped by medicine.  You have a headache that is different from the usual headache.  You have nausea or you vomit.  You have a fever. SEEK IMMEDIATE MEDICAL CARE IF:   Your headache becomes severe.  You have repeated vomiting.  You have a stiff neck.  You have a loss of vision.  You have problems with speech.  You have pain in the eye or ear.  You have muscular weakness or loss of muscle control.  You lose your balance or have trouble walking.  You feel faint or pass out.  You have confusion.   This information is not intended to replace advice given to you by your health care provider. Make sure you discuss any questions you have with your health care provider.   Document Released: 09/06/2005 Document Revised: 05/28/2015 Document Reviewed: 12/30/2014 Elsevier Interactive Patient Education [employer]

## 2016-05-26 ENCOUNTER — Ambulatory Visit (HOSPITAL_COMMUNITY)
Admission: EM | Admit: 2016-05-26 | Discharge: 2016-05-26 | Disposition: A | Discharge status: Home / Self Care | Payer: BLUE CROSS/BLUE SHIELD | Attending: Emergency Medicine | Admitting: Emergency Medicine

## 2016-05-26 ENCOUNTER — Encounter (HOSPITAL_COMMUNITY): Payer: Self-pay | Admitting: Emergency Medicine

## 2016-05-26 DIAGNOSIS — R51 Headache: Secondary | ICD-10-CM

## 2016-05-26 MED ORDER — TRAMADOL HCL 50 MG PO TABS: 50.0000 mg | ORAL_TABLET | Freq: Four times a day (QID) | ORAL | 0 refills | Status: AC | PRN

## 2016-05-26 MED ORDER — CYCLOBENZAPRINE HCL 10 MG PO TABS: 10.0000 mg | ORAL_TABLET | Freq: Three times a day (TID) | ORAL | 0 refills | Status: AC | PRN
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
## 2024-09-15 ENCOUNTER — Other Ambulatory Visit: Payer: Self-pay

## 2024-09-15 ENCOUNTER — Encounter (HOSPITAL_COMMUNITY): Payer: Self-pay

## 2024-09-15 ENCOUNTER — Emergency Department (HOSPITAL_COMMUNITY): Admission: EM | Admit: 2024-09-15 | Discharge: 2024-09-16

## 2024-09-15 DIAGNOSIS — R29810 Facial weakness: Secondary | ICD-10-CM | POA: Diagnosis present

## 2024-09-15 DIAGNOSIS — Z5321 Procedure and treatment not carried out due to patient leaving prior to being seen by health care provider: Secondary | ICD-10-CM | POA: Insufficient documentation

## 2024-09-15 LAB — BASIC METABOLIC PANEL WITH GFR
Anion gap: 9 (ref 5–15)
BUN: 16 mg/dL (ref 6–20)
CO2: 26 mmol/L (ref 22–32)
Calcium: 9.6 mg/dL (ref 8.9–10.3)
Chloride: 104 mmol/L (ref 98–111)
Creatinine, Ser: 1.32 mg/dL — ABNORMAL HIGH (ref 0.61–1.24)
GFR, Estimated: >60 mL/min
Glucose, Bld: 96 mg/dL (ref 70–99)
Potassium: 3.9 mmol/L (ref 3.5–5.1)
Sodium: 139 mmol/L (ref 135–145)

## 2024-09-15 LAB — CBC
HCT: 44.6 % (ref 39.0–52.0)
Hemoglobin: 14.9 g/dL (ref 13.0–17.0)
MCH: 29.4 pg (ref 26.0–34.0)
MCHC: 33.4 g/dL (ref 30.0–36.0)
MCV: 88 fL (ref 80.0–100.0)
Platelets: 260 K/uL (ref 150–400)
RBC: 5.07 MIL/uL (ref 4.22–5.81)
RDW: 13.3 % (ref 11.5–15.5)
WBC: 8.2 K/uL (ref 4.0–10.5)
nRBC: 0 % (ref 0.0–0.2)

## 2024-09-15 NOTE — ED Triage Notes (Signed)
 Pt gives verbal consent for mse

## 2024-09-15 NOTE — ED Triage Notes (Signed)
 Quick triage note: Pt to ED c/o left side of face frozen Pt reports noticed this 1 hour ago when went to bathroom, not sure when started. Redness noted to left eye. Slight facial droop. No other neuro deficits noted.  Denies pain.

## 2024-09-15 NOTE — ED Provider Triage Note (Signed)
 Emergency Medicine Provider Triage Evaluation Note  Billy Clarke , a 43 y.o. male  was evaluated in triage.  Pt complains of left-sided facial paralysis.  Noticed onset of symptoms around 1 hour ago when he was looking in the mirror in the bathroom, left side of face feels numb, feels like I am blinking faster on the right side than the left.  Denies weakness/numbness/tingling of upper and lower extremities, denies vision changes, denies nausea/vomiting, denies headache.  Noted dizziness for the past several days which he attributed to lack of sleep, dizziness is not currently present.  Review of Systems  Positive: As above Negative: As above  Physical Exam  There were no vitals taken for this visit. Gen:   Awake, no distress   Resp:  Normal effort  MSK:   Moves extremities without difficulty  Other:  Left sided facial paralysis with forehead involvement, incomplete blink of L eye, 5/5 strength against resistance of bilateral upper and lower extremities, negative pronator drift, normal cerebellar testing including finger-to-nose, ambulates on his own without evidence of gait ataxia  Medical Decision Making  Medically screening exam initiated at 5:00 PM.  Appropriate orders placed.  Billy Clarke was informed that the remainder of the evaluation will be completed by another provider, this initial triage assessment does not replace that evaluation, and the importance of remaining in the ED until their evaluation is complete.  Symptoms most consistent with Bell's palsy given forehead involvement without additional neurologic deficit on exam.  Dr. Bernard evaluated the patient at the bedside and agrees with this assessment, Code stroke note activated.    Billy Clarke, [place] 09/15/24 1706

## 2024-09-16 ENCOUNTER — Encounter (HOSPITAL_COMMUNITY): Payer: Self-pay

## 2024-09-16 ENCOUNTER — Ambulatory Visit (HOSPITAL_COMMUNITY): Admission: EM | Admit: 2024-09-16 | Discharge: 2024-09-16 | Disposition: A | Discharge status: Home / Self Care

## 2024-09-16 DIAGNOSIS — G510 Bell's palsy: Secondary | ICD-10-CM | POA: Diagnosis not present

## 2024-09-16 MED ORDER — PREDNISONE 10 MG PO TABS: ORAL_TABLET | ORAL | 0 refills | Status: AC

## 2024-09-16 NOTE — ED Provider Notes (Signed)
 " [facility]    CSN: [id number] Arrival date & time: 09/16/24  0806      History   Chief Complaint Chief Complaint  Patient presents with   Facial Droop    HPI NIMAI BURBACH is a 43 y.o. male.   Discussed the use of AI scribe software for clinical note transcription with the patient, who gave verbal consent to proceed.   The patient presents with acute onset left-sided facial weakness that began yesterday afternoon approximately one hour before seeking emergency care. While looking in the mirror after using the restroom, the patient noticed inability to fully close the left eye and observed that the left eye was blinking faster than the right eye. The patient also reports frequent tearing of the left eye, facial droop on the left side, and weakness of the left side of the face. This morning the patient experienced some light sensitivity, describing their room as unusually bright.  The patient went to the emergency department yesterday around 5 PM where they underwent an initial medical screening evaluation. The emergency department provider assessed the facial droop as likely Bell's palsy versus stroke or other neurologic condition, but the patient ultimately left after waiting in the lobby for an extended period without being seen.  The patient denies numbness or tingling on the left side of the face, dizziness, headaches, speech problems, neck pain or stiffness, vision changes, chest pain, shortness of breath, palpitations, nausea, vomiting, rash, swallowing difficulties, sore throat, fever, or recent illness. No extremity weakness. This has never happened before. The patient has no medical problems and takes no daily medications. The patient does not smoke or vape.  The following sections of the patient's history were reviewed and updated as appropriate: allergies, current medications, past family history, past medical history, past social history, past surgical history, and  problem list.     Past Medical History:  Diagnosis Date   Anxiety    Asthma     Patient Active Problem List   Diagnosis Date Noted   PALPITATIONS 05/15/2009   SHORTNESS OF BREATH 05/15/2009   CHEST PAIN-UNSPECIFIED 05/15/2009    Past Surgical History:  Procedure Laterality Date   nail bed repaired         Home Medications    Prior to Admission medications  Medication Sig Start Date End Date Taking? Authorizing Provider  FLUoxetine (PROZAC) 10 MG capsule Take 10 mg by mouth daily. 06/08/24  Yes [provider]  predniSONE  (DELTASONE ) 10 MG tablet Take 6 tablets (60 mg total) by mouth daily with breakfast for 7 days, THEN 5 tablets (50 mg total) daily with breakfast for 1 day, THEN 4 tablets (40 mg total) daily with breakfast for 1 day, THEN 3 tablets (30 mg total) daily with breakfast for 1 day, THEN 2 tablets (20 mg total) daily with breakfast for 1 day, THEN 1 tablet (10 mg total) daily with breakfast for 1 day. 09/16/24 09/28/24 Yes Iola Lukes, FNP  sildenafil (VIAGRA) 50 MG tablet Take 50 mg by mouth as needed for erectile dysfunction. 01/18/20  Yes [provider]  albuterol (PROVENTIL HFA;VENTOLIN HFA) 108 (90 BASE) MCG/ACT inhaler Inhale 2 puffs into the lungs every 6 (six) hours as needed. Wheezing or shortness of breath     [provider]  Multiple Vitamin (MULTIVITAMIN WITH MINERALS) TABS tablet Take 1 tablet by mouth daily.    [provider]    Family History Family History  Problem Relation Age of Onset  Rheum arthritis Mother     Social History Social History[1]   Allergies   Patient has no known allergies.   Review of Systems Review of Systems  Constitutional:  Negative for fever.  HENT:  Negative for sore throat and trouble swallowing.   Eyes:  Positive for photophobia and discharge. Negative for visual disturbance.  Respiratory:  Negative for shortness of breath.   Cardiovascular:  Negative for chest  pain and palpitations.  Gastrointestinal:  Negative for nausea and vomiting.  Musculoskeletal:  Negative for neck pain and neck stiffness.  Skin:  Negative for rash.  Neurological:  Positive for dizziness and weakness. Negative for speech difficulty, numbness and headaches.  All other systems reviewed and are negative.    Physical Exam Triage Vital Signs ED Triage Vitals [09/16/24 0835]  Encounter Vitals Group     BP (!) 142/95     Girls Systolic BP Percentile      Girls Diastolic BP Percentile      Boys Systolic BP Percentile      Boys Diastolic BP Percentile      Pulse Rate 61     Resp 16     Temp 98.5 F (36.9 C)     Temp Source Oral     SpO2 98 %     Weight      Height      Head Circumference      Peak Flow      Pain Score 0     Pain Loc      Pain Education      Exclude from Growth Chart    No data found.  Updated Vital Signs BP (!) 142/95 (BP Location: Right Arm)   Pulse 61   Temp 98.5 F (36.9 C) (Oral)   Resp 16   SpO2 98%   Visual Acuity Right Eye Distance:   Left Eye Distance:   Bilateral Distance:    Right Eye Near:   Left Eye Near:    Bilateral Near:     Physical Exam Vitals reviewed.  Constitutional:      General: He is awake. He is not in acute distress.    Appearance: Normal appearance. He is well-developed. He is not ill-appearing, toxic-appearing or diaphoretic.  HENT:     Head: Normocephalic.     Right Ear: Hearing normal.     Left Ear: Hearing normal.     Nose: Nose normal.     Mouth/Throat:     Mouth: Mucous membranes are moist.     Pharynx: Oropharynx is clear. Uvula midline.     Comments: Asymmetric smile with left-sided facial droop noted. Decreased movement of the left oral commissure. Speech clear without dysarthria. Eyes:     General: Lids are normal. Vision grossly intact. Gaze aligned appropriately. No visual field deficit.       Left eye: Discharge present.    Extraocular Movements: Extraocular movements intact.      Right eye: Normal extraocular motion and no nystagmus.     Left eye: Normal extraocular motion and no nystagmus.     Conjunctiva/sclera: Conjunctivae normal.     Left eye: Left conjunctiva is not injected.     Comments: Incomplete closure of the left eyelid noted with decreased blink reflex on the left. Frequent tearing noted from left eye. No conjunctival injection.   Neck:     Trachea: Phonation normal.  Cardiovascular:     Rate and Rhythm: Normal rate and regular rhythm.     Heart  sounds: Normal heart sounds.  Pulmonary:     Effort: Pulmonary effort is normal.     Breath sounds: Normal breath sounds and air entry.  Musculoskeletal:        General: Normal range of motion.     Cervical back: Full passive range of motion without pain, normal range of motion and neck supple.  Lymphadenopathy:     Cervical: No cervical adenopathy.  Skin:    General: Skin is warm and dry.     Comments: No vesicular rash or lesions noted  Neurological:     General: No focal deficit present.     Mental Status: He is alert and oriented to person, place, and time.     Cranial Nerves: Cranial nerve deficit present.     Motor: Motor function is intact.     Coordination: Coordination is intact.     Gait: Gait is intact.     Comments: Cranial nerve VII deficit on the left. Inability to fully raise the left eyebrow, decreased forehead wrinkling, flattened left nasolabial fold, and drooping of the left corner of the mouth. Sensation intact to light touch bilaterally. Strength 5/5 in bilateral upper and lower extremities.   Psychiatric:        Speech: Speech normal.        Behavior: Behavior is cooperative.      UC Treatments / Results  Labs (all labs ordered are listed, but only abnormal results are displayed) Labs Reviewed - No data to display  EKG   Radiology No results found.  Procedures Procedures (including critical care time)  Medications Ordered in UC Medications - No data to  display  Initial Impression / Assessment and Plan / UC Course  I have reviewed the triage vital signs and the nursing notes.  Pertinent labs & imaging results that were available during my care of the patient were reviewed by me and considered in my medical decision making (see chart for details).     The patient presents with acute onset left-sided facial weakness consistent with Bells palsy. Physical examination demonstrates an isolated left cranial nerve VII deficit involving the forehead, eye closure, and lower face, supporting a peripheral etiology. There are no additional focal neurologic deficits, altered mental status, speech disturbance, or motor weakness to suggest central pathology or acute cerebrovascular event. Clinical presentation is most consistent with Bells palsy. Treatment initiated with oral corticosteroids. Eye protection and lubrication were discussed due to incomplete eyelid closure. The patient was counseled on expected course, warning signs, and strict return precautions, including worsening neurologic symptoms, vision changes, or inability to protect the eye. Follow-up with primary care and/or neurology was advised.  Today's evaluation has revealed no signs of a dangerous process. Discussed diagnosis with patient and/or guardian. Patient and/or guardian aware of their diagnosis, possible red flag symptoms to watch out for and need for close follow up. Patient and/or guardian understands verbal and written discharge instructions. Patient and/or guardian comfortable with plan and disposition.  Patient and/or guardian has a clear mental status at this time, good insight into illness (after discussion and teaching) and has clear judgment to make decisions regarding their care  Documentation was completed with the aid of voice recognition software. Transcription may contain typographical errors.  Final Clinical Impressions(s) / UC Diagnoses   Final diagnoses:  Bell's palsy      Discharge Instructions      You were seen today for left-sided facial weakness, which is most consistent with Bells Palsy. This condition is  caused by temporary inflammation of the facial nerve and is not a stroke. Symptoms often improve gradually over weeks, and many people recover fully. Take the prescribed steroid medication exactly as directed, preferably earlier in the day and with food. This helps reduce nerve inflammation and improve recovery. Because your left eye does not close completely, it is important to protect it. Use lubricating eye drops during the day to prevent dryness and consider taping the eye closed with paper tape while sleeping. Wearing sunglasses outdoors can help protect the eye from wind and debris. Gentle facial exercises, such as smiling, raising your eyebrows, and puckering your lips, may be helpful as symptoms begin to improve.  Follow up with your primary care provider within the next 1-2 weeks. Referral to neurology or ENT may be recommended if symptoms do not begin to improve, worsen, or persist beyond several weeks. Go to the emergency department immediately if you develop new or worsening symptoms such as weakness in an arm or leg, difficulty speaking or swallowing, confusion, severe headache, vision changes, facial numbness, dizziness, chest pain, or if you are unable to protect your eye despite treatment.      ED Prescriptions     Medication Sig Dispense Auth. Provider   predniSONE  (DELTASONE ) 10 MG tablet Take 6 tablets (60 mg total) by mouth daily with breakfast for 7 days, THEN 5 tablets (50 mg total) daily with breakfast for 1 day, THEN 4 tablets (40 mg total) daily with breakfast for 1 day, THEN 3 tablets (30 mg total) daily with breakfast for 1 day, THEN 2 tablets (20 mg total) daily with breakfast for 1 day, THEN 1 tablet (10 mg total) daily with breakfast for 1 day. 57 tablet Iola Lukes, FNP      PDMP not reviewed this encounter.      [1]  Social History Tobacco Use   Smoking status: Never   Smokeless tobacco: Never  Substance Use Topics   Alcohol use: Yes    Comment: occasionally   Drug use: No     Iola Lukes, FNP 09/16/24 9090  "

## 2024-09-16 NOTE — ED Triage Notes (Addendum)
 Patient here today with c/o left side facial drooping since yesterday. Patient went to the ED last night and was triaged but waited in the lobby for too long and decided to come here. Patient states that they told him that it was Bells Palsy. Denies other symptoms.

## 2024-09-16 NOTE — ED Notes (Signed)
 PT let MIA

## 2024-09-16 NOTE — Discharge Instructions (Addendum)
 You were seen today for left-sided facial weakness, which is most consistent with Bells Palsy. This condition is caused by temporary inflammation of the facial nerve and is not a stroke. Symptoms often improve gradually over weeks, and many people recover fully. Take the prescribed steroid medication exactly as directed, preferably earlier in the day and with food. This helps reduce nerve inflammation and improve recovery. Because your left eye does not close completely, it is important to protect it. Use lubricating eye drops during the day to prevent dryness and consider taping the eye closed with paper tape while sleeping. Wearing sunglasses outdoors can help protect the eye from wind and debris. Gentle facial exercises, such as smiling, raising your eyebrows, and puckering your lips, may be helpful as symptoms begin to improve.  Follow up with your primary care provider within the next 1-2 weeks. Referral to neurology or ENT may be recommended if symptoms do not begin to improve, worsen, or persist beyond several weeks. Go to the emergency department immediately if you develop new or worsening symptoms such as weakness in an arm or leg, difficulty speaking or swallowing, confusion, severe headache, vision changes, facial numbness, dizziness, chest pain, or if you are unable to protect your eye despite treatment.

## 2024-09-16 NOTE — ED Notes (Signed)
 Patient left AMA.
